# Patient Record
Sex: Female | Born: 1960 | Race: White | Hispanic: No | Smoking: Never smoker
Health system: Southern US, Community
[De-identification: ages and names within clinical notes are randomized; demographics above are authoritative.]

## PROBLEM LIST (undated history)

## (undated) DIAGNOSIS — G35 Multiple sclerosis: Secondary | ICD-10-CM

## (undated) HISTORY — PX: CARPAL TUNNEL RELEASE: SHX101

## (undated) HISTORY — PX: TONSILLECTOMY: SUR1361

## (undated) HISTORY — DX: Multiple sclerosis: G35

---

## 2003-04-22 ENCOUNTER — Other Ambulatory Visit: Admission: RE | Admit: 2003-04-22 | Discharge: 2003-04-22 | Payer: Self-pay | Admitting: *Deleted

## 2003-04-24 ENCOUNTER — Encounter: Admission: RE | Admit: 2003-04-24 | Discharge: 2003-04-24 | Payer: Self-pay | Admitting: *Deleted

## 2004-03-11 ENCOUNTER — Ambulatory Visit (HOSPITAL_BASED_OUTPATIENT_CLINIC_OR_DEPARTMENT_OTHER): Admission: RE | Admit: 2004-03-11 | Discharge: 2004-03-11 | Payer: Self-pay | Admitting: Orthopedic Surgery

## 2005-03-16 ENCOUNTER — Encounter: Admission: RE | Admit: 2005-03-16 | Discharge: 2005-03-16 | Payer: Self-pay | Admitting: Family Medicine

## 2005-05-10 ENCOUNTER — Ambulatory Visit (HOSPITAL_COMMUNITY): Admission: RE | Admit: 2005-05-10 | Discharge: 2005-05-10 | Payer: Self-pay | Admitting: Neurology

## 2008-09-03 ENCOUNTER — Emergency Department (HOSPITAL_COMMUNITY): Admission: EM | Admit: 2008-09-03 | Discharge: 2008-09-03 | Payer: Self-pay | Admitting: Emergency Medicine

## 2010-01-23 ENCOUNTER — Encounter: Payer: Self-pay | Admitting: Specialist

## 2010-05-20 NOTE — Op Note (Signed)
NAME:  Angela Monroe, Angela Monroe                   ACCOUNT NO.:  o   MEDICAL RECORD NO.:  [id number]          PATIENT TYPE:  AMB   LOCATION:  DSC                          FACILITY:  [facility]   PHYSICIAN:  Katy Fitch. Sypher Montez Hageman., M.D.DATE OF BIRTH:  1960-05-02   DATE OF PROCEDURE:  03/11/2004  DATE OF DISCHARGE:                                 OPERATIVE REPORT   PREOPERATIVE DIAGNOSIS:  Entrapment neuropathy median nerve, right carpal  tunnel.   POSTOPERATIVE DIAGNOSIS:  Entrapment neuropathy median nerve, right carpal  tunnel.   OPERATION:  Release of right transverse carpal ligament.   OPERATING SURGEON:  Katy Fitch. Sypher, M.D.   ASSISTANT:  Jonni Sanger, P.A.   ANESTHESIA:  Eneral by LMA.   SUPERVISING ANESTHESIOLOGIST:  Dr. Gelene Mink.   INDICATIONS:  [name] a 50 year old woman referred by Dr. Trey Sailors,  neurosurgeon for evaluation and management of hand numbness and discomfort.  Clinical examination suggested carpal tunnel syndrome. Electrodiagnostic  studies by Dr. Johna Roles confirmed moderately severe right carpal tunnel  syndrome.   Due to her failure to respond to nonoperative measures including splinting,  activity modification and injection, she is brought to the operating room at  this time for release of right transverse carpal ligament.   PROCEDURE:  Nichele Slawson was brought to operating room and placed in supine  position on the operating table.  Following induction of general anesthesia  by LMA, the right arm was prepped with Betadine soap and solution and  sterilely draped.  Following exsanguination of the limb with an Esmarch  bandage, an arterial tourniquet on the proximal brachium was inflated to 220  mmHg.   The procedure commenced with a short incision in the line of the ring finger  in the palm. The subcutaneous tissues were carefully divided revealing the  palmar fascia. This was split longitudinally to reveal the common sensory  branch of the median  nerve.  These were followed back to transverse carpal  ligament, which was carefully isolated from the median nerve.  The  transverse carpal ligament released along its ulnar border extending into  the distal forearm.. This widely opened carpal canal.  No mass or other  predicaments were noted.   Bleeding points along the margin of the released ligament were  electrocauterized with bipolar current.  The wound was then repaired with  intradermal 3-0 Prolene suture.  A compressive dressing was applied with a  volar plaster splint maintaining the wrist in 5 degrees of dorsiflexion.     There were no apparent complications.  Ms. Sofia was then awakened from  anesthesia and transferred to recovery room with stable signs.  She will be  discharged prescription for Percocet 5 milligrams one p.o. q. 4 to 6 hours  p.r.n. pain, 20 tablets without refill.      RVS/MEDQ  D:  03/11/2004  T:  03/12/2004  Job:  [id number]

## 2011-05-26 IMAGING — CR DG KNEE COMPLETE 4+V*L*
4 series · 4 of 4 positions shown · non-contrast
Comparison: None available.

CLINICAL DATA: Fall, pain.

LEFT KNEE - COMPLETE 4+ VIEW

[t knee ap left]
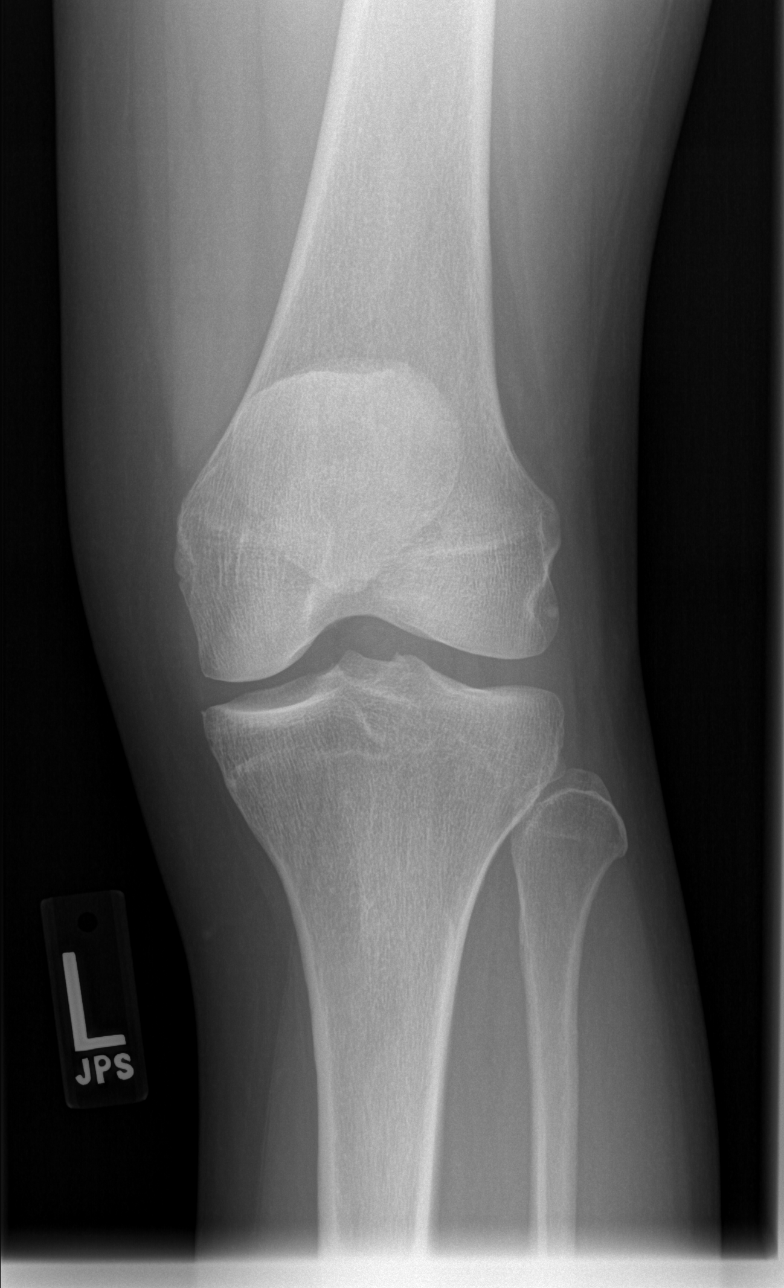

[t knee oblique left (1 of 2)]
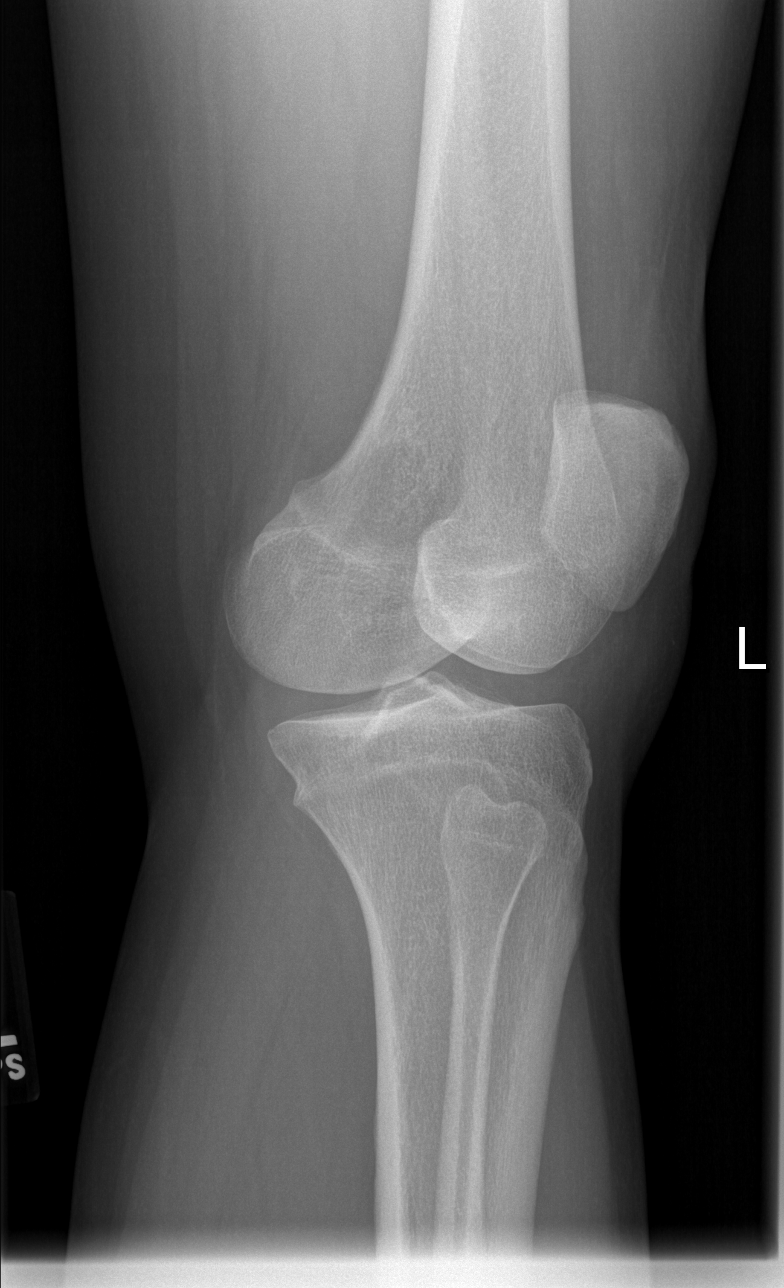

[t knee oblique left (2 of 2)]
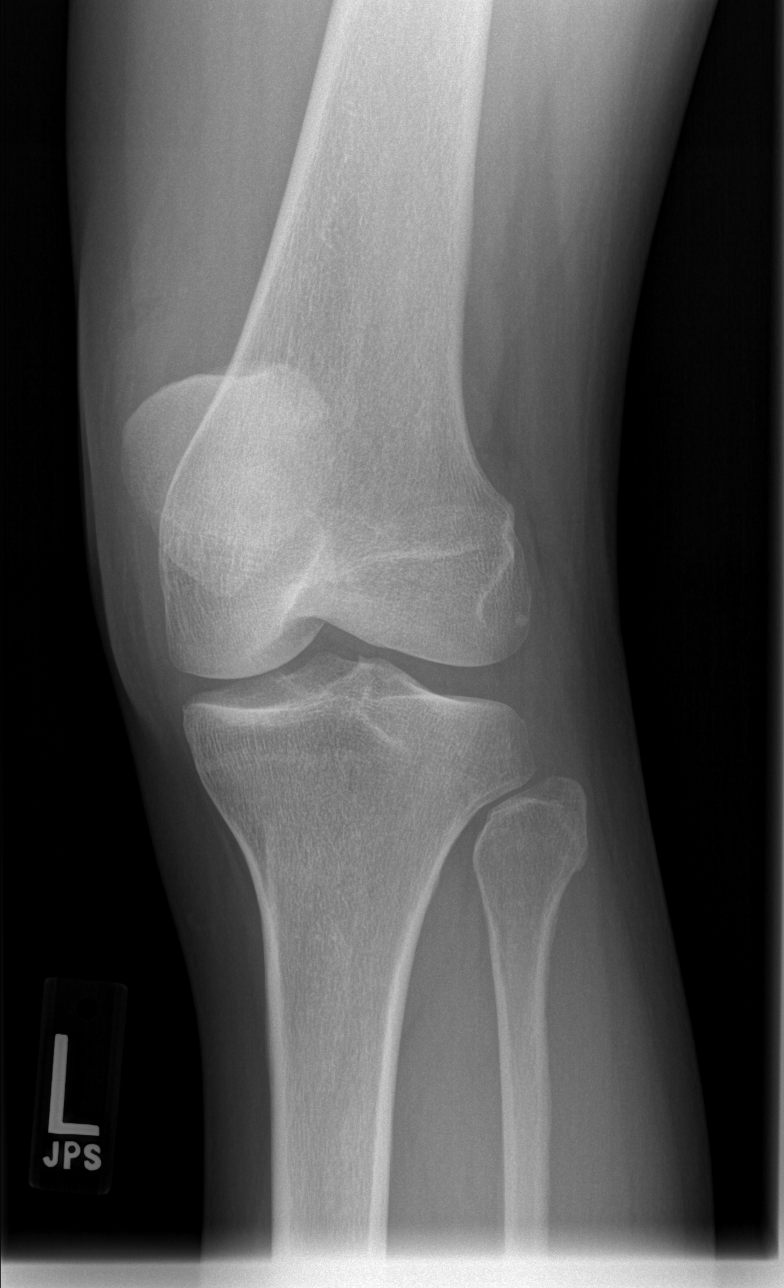

[t knee lat left]
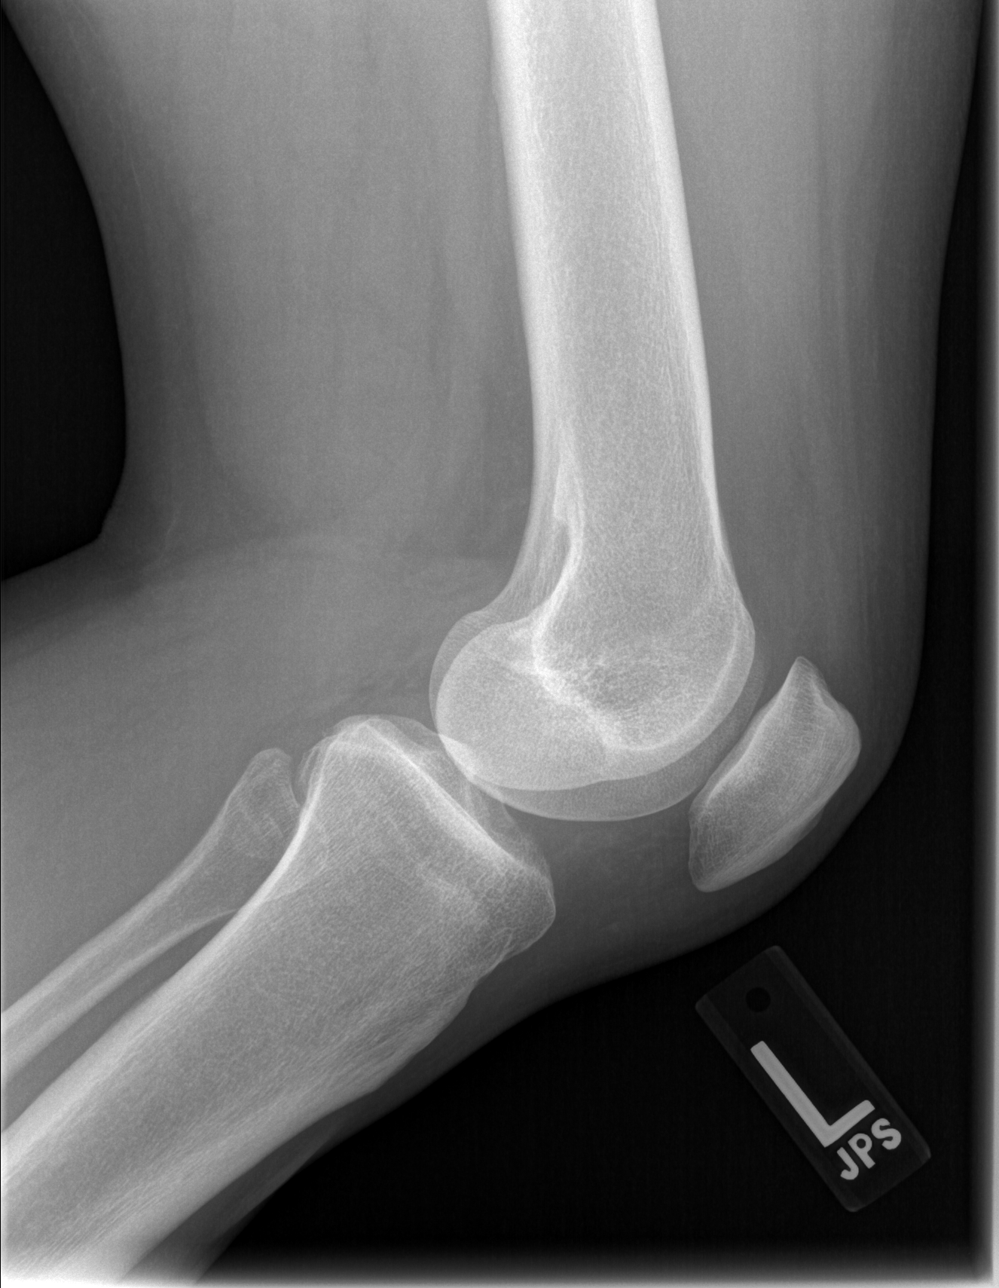

[4 of 4 positions shown; findings below may reference images not displayed]

FINDINGS: Imaged bones, joints and soft tissues appear normal.
IMPRESSION: Negative exam.

## 2011-06-07 ENCOUNTER — Ambulatory Visit (INDEPENDENT_AMBULATORY_CARE_PROVIDER_SITE_OTHER): Payer: Managed Care, Other (non HMO) | Admitting: Internal Medicine

## 2011-06-07 ENCOUNTER — Encounter: Payer: Self-pay | Admitting: Internal Medicine

## 2011-06-07 VITALS — BP 142/80 | HR 55 | Temp 97.9°F | Ht 66.0 in | Wt 168.0 lb

## 2011-06-07 DIAGNOSIS — M25559 Pain in unspecified hip: Secondary | ICD-10-CM

## 2011-06-07 DIAGNOSIS — G35 Multiple sclerosis: Secondary | ICD-10-CM | POA: Insufficient documentation

## 2011-06-07 MED ORDER — MELOXICAM 7.5 MG PO TABS: 7.5000 mg | ORAL_TABLET | Freq: Every day | ORAL | Status: AC | PRN

## 2011-06-07 NOTE — Patient Instructions (Addendum)
Take meloxicam as prescribed, always taken with food, watch for stomach side effects such as pain, nausea or change in the color of the stools. Call in 2 to 3 weeks if you're not improving, call anytime if you've worse ------- Please get your x-ray at the other [place]  office located at: [address] [place], across from [facility].  Please go to the basement, this is a walk-in facility, they are open from 8:30 to 5:30 PM. Phone number [phone].

## 2011-06-07 NOTE — Assessment & Plan Note (Addendum)
Few months history of right-sided pain, around the hip. Some of the pain involves the right buttock. Most likely pain is hip related, less likely is a radiculopathy. Patient wonder if it is related with "the  chair I'm using at work" but I can't tell if that is the case. Reports right hip injury many years ago. Plan: X-rays Meloxicam If not better consider also referral to ortho

## 2011-06-07 NOTE — Progress Notes (Signed)
  Subjective:    Patient ID: Angela Monroe, female    DOB: 1960/09/11, 51 y.o.   MRN: [id number]  HPI New patient, acute visit. 5-6 month history of "hip pain". Pain is located at the right buttock and anterior side of the right hip. It usually gets worse or better depending on the position. Aleve does help temporarily. Wonders if pain related to her new job where she has to sit almost all day.  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  Family history Diabetes--no CAD--no Stroke-- F MS-- sister Colon cancer--no Breast cancer--no   Review of Systems Denies any rash at the area of the pain. 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 Very seldom has mild bilateral low back pain.    Objective:   Physical Exam  General -- alert, well-developed. No apparent distress.  Lungs -- normal respiratory effort, no intercostal retractions, no accessory muscle use, and normal breath sounds.   Heart-- normal rate, regular rhythm, no murmur, and no gallop.   Musculoskeletal: Not tender to palpation in the lower back..   Extremities-- no pretibial edema bilaterally , hip rotation normal, no tender at the trochanteric bursa on either side. Neurologic-- alert & oriented X3 , gait and motor are normal, DTRs symmetric, slightly decreased at both ankles. Psych-- Cognition and judgment appear intact. Alert and cooperative with normal attention span and concentration.  not anxious appearing and not depressed appearing.         Assessment & Plan:

## 2011-10-13 ENCOUNTER — Ambulatory Visit (INDEPENDENT_AMBULATORY_CARE_PROVIDER_SITE_OTHER): Payer: Managed Care, Other (non HMO) | Admitting: Family Medicine

## 2011-10-13 VITALS — BP 143/84 | HR 59 | Temp 98.4°F | Resp 16 | Ht 67.0 in | Wt 170.0 lb

## 2011-10-13 DIAGNOSIS — Z13220 Encounter for screening for lipoid disorders: Secondary | ICD-10-CM

## 2011-10-13 LAB — LIPID PANEL
Cholesterol: 249 mg/dL — ABNORMAL HIGH (ref 0–200)
HDL: 41 mg/dL (ref >39)
LDL Cholesterol: 176 mg/dL — ABNORMAL HIGH (ref 0–99)
Total CHOL/HDL Ratio: 6.1 ratio
Triglycerides: 162 mg/dL — ABNORMAL HIGH (ref <150)
VLDL: 32 mg/dL (ref 0–40)

## 2011-10-13 NOTE — Progress Notes (Signed)
  [facility]:  Office Visit  Chief Complaint:  Chief Complaint  Patient presents with  . Annual Exam    for insurance, +lipid panel    HPI: Angela Monroe is a 51 y.o. female who complains of  Here for screening lipids for work. No complaints.   Past Medical History  Diagnosis Date  . MS (multiple sclerosis)    Past Surgical History  Procedure Date  . Tonsillectomy    History   Social History  . Marital Status: Single    Spouse Name: N/A    Number of Children: N/A  . Years of Education: N/A   Social History Main Topics  . Smoking status: Never Smoker   . Smokeless tobacco: Never Used  . Alcohol Use: No  . Drug Use: No  . Sexually Active:    Other Topics Concern  . None   Social History Narrative  . None   Family History  Problem Relation Age of Onset  . Hypertension Mother   . Stroke Father   . Hypertension Father    Allergies  Allergen Reactions  . Contrast Media (Iodinated Diagnostic Agents)     Per pt-- "dry mouth, affect and my salivary glands, I don't like it again"   Prior to Admission medications   Medication Sig Start Date End Date Taking? Authorizing Provider  Glatiramer Acetate (COPAXONE [place]) Inject into the skin daily.   Yes Historical Provider, MD  meloxicam (MOBIC) 7.5 MG tablet Take 1-2 tablets (7.5-15 mg total) by mouth daily as needed for pain. 06/07/11 06/06/12  [name], MD     ROS: The patient denies fevers, chills, night sweats, unintentional weight loss, chest pain, palpitations, wheezing, dyspnea on exertion, nausea, vomiting, abdominal pain, dysuria, hematuria, melena, numbness, weakness, or tingling.   All other systems have been reviewed and were otherwise negative with the exception of those mentioned in the HPI and as above.    PHYSICAL EXAM: Filed Vitals:   10/13/11 0815  BP: 143/84  Pulse: 59  Temp: 98.4 F (36.9 C)  Resp: 16   Filed Vitals:   10/13/11 0815  Height: 5\' 7"  (1.702 m)  Weight: 170  lb (77.111 kg)   Body mass index is 26.63 kg/(m^2).  General: Alert, no acute distress HEENT:  Normocephalic, atraumatic, oropharynx patent.  Cardiovascular:  Regular rate and rhythm, no rubs murmurs or gallops.  No Carotid bruits, radial pulse intact. No pedal edema.  Respiratory: Clear to auscultation bilaterally.  No wheezes, rales, or rhonchi.  No cyanosis, no use of accessory musculature GI: No organomegaly, abdomen is soft and non-tender, positive bowel sounds.  No masses. Skin: No rashes. Neurologic: Facial musculature symmetric. Psychiatric: Patient is appropriate throughout our interaction. Lymphatic: No cervical lymphadenopathy Musculoskeletal: Gait intact.   LABS: No results found for this or any previous visit.   EKG/XRAY:   Primary read interpreted by Dr. Conley Rolls at [facility].   ASSESSMENT/PLAN: Encounter Diagnosis  Name Primary?  . Screening for hyperlipidemia Yes   Lipid panel.  She was advised that it may be tainted since she had coffeee with cream and sugar but she just wants it done anyways.    ,  PHUONG, DO 10/13/2011 8:49 AM

## 2011-10-15 ENCOUNTER — Encounter: Payer: Self-pay | Admitting: Family Medicine

## 2012-02-17 ENCOUNTER — Other Ambulatory Visit: Payer: Self-pay

## 2012-09-23 ENCOUNTER — Encounter: Payer: Self-pay | Admitting: Gastroenterology

## 2012-10-18 ENCOUNTER — Ambulatory Visit: Payer: Managed Care, Other (non HMO) | Admitting: Gastroenterology

## 2012-11-07 ENCOUNTER — Other Ambulatory Visit: Payer: Self-pay

## 2013-04-13 ENCOUNTER — Ambulatory Visit (INDEPENDENT_AMBULATORY_CARE_PROVIDER_SITE_OTHER): Payer: Managed Care, Other (non HMO) | Admitting: Family Medicine

## 2013-04-13 VITALS — BP 146/92 | HR 71 | Temp 97.8°F | Resp 16 | Ht 65.5 in | Wt 175.0 lb

## 2013-04-13 DIAGNOSIS — R1031 Right lower quadrant pain: Secondary | ICD-10-CM

## 2013-04-13 MED ORDER — MELOXICAM 7.5 MG PO TABS: 7.5000 mg | ORAL_TABLET | Freq: Every day | ORAL | Status: DC

## 2013-04-13 NOTE — Progress Notes (Signed)
This chart was scribed for Elvina SidleKurt Lauenstein, MD, by Yevette EdwardsAngela Bracken, ED Scribe. This patient's care was started at 4:44 PM.  Patient ID: Angela Monroe, female   DOB: February 13, 1960, 53 y.o.   MRN: [id number]      Patient Name: Angela Monroe Date of Birth: February 13, 1960 Medical Record Number: [id number] Gender: female Date of Encounter: 04/13/2013  Chief Complaint: Possible Infection   History of Present Illness:  Angela Monroe is a 53 y.o. very pleasant female patient, with a h/o MS, who presents with the following:  Pain and associated swelling to her right medial thigh which radiates to her right buttocks. The pain began five days ago and is increased with pressure. The pain began the day after she had used the site as an injection site for MS medication. She has used IBU with moderate relief. She denies a rash.   She reports she has not had a flair-up of MS for five years.   Patient Active Problem List   Diagnosis Date Noted   Hip pain 06/07/2011   Multiple sclerosis 06/07/2011   Past Medical History  Diagnosis Date   MS (multiple sclerosis)    Past Surgical History  Procedure Laterality Date   Tonsillectomy     History  Substance Use Topics   Smoking status: Never Smoker    Smokeless tobacco: Never Used   Alcohol Use: No   Family History  Problem Relation Age of Onset   Hypertension Mother    Stroke Father    Hypertension Father    Allergies  Allergen Reactions   Contrast Media [Iodinated Diagnostic Agents]     Per pt-- "dry mouth, affect and my salivary glands, I don't like it again"    Medication list has been reviewed and updated.  Current Outpatient Prescriptions on File Prior to Visit  Medication Sig Dispense Refill   Glatiramer Acetate (COPAXONE [place]) Inject into the skin daily.       No current facility-administered medications on file prior to visit.    Review of Systems:  Positive: right thigh pain and associated swelling Negative:  rash  Physical Examination: Filed Vitals:   04/13/13 1630  BP: 146/92  Pulse: 71  Temp: 97.8 F (36.6 C)  Resp: 16   Body mass index is 28.67 kg/(m^2). Ideal Body Weight: @FLOWAMB ([id number])@[phone]@  Mild tenderness to medial right proximal thigh with good ROM and no skin discoloration or rash.   EKG / Labs / Xrays: None available at time of encounter  Assessment and Plan:  Will prescribe pt Meloxicam Informed pt to follow-up if a rash develops.   Yevette EdwardsAngela Bracken, ED Scribe Right groin pain - Plan: meloxicam (MOBIC) 7.5 MG tablet  Signed, Elvina SidleKurt Lauenstein, MD

## 2014-05-02 ENCOUNTER — Ambulatory Visit (INDEPENDENT_AMBULATORY_CARE_PROVIDER_SITE_OTHER): Payer: Managed Care, Other (non HMO) | Admitting: Emergency Medicine

## 2014-05-02 VITALS — BP 116/90 | HR 60 | Temp 97.9°F | Resp 12 | Ht 65.25 in | Wt 173.5 lb

## 2014-05-02 DIAGNOSIS — H8110 Benign paroxysmal vertigo, unspecified ear: Secondary | ICD-10-CM | POA: Diagnosis not present

## 2014-05-02 MED ORDER — MECLIZINE HCL 25 MG PO TABS: 25.0000 mg | ORAL_TABLET | Freq: Three times a day (TID) | ORAL | Status: DC | PRN

## 2014-05-02 NOTE — Patient Instructions (Signed)
Benign Positional Vertigo Vertigo means you feel like you or your surroundings are moving when they are not. Benign positional vertigo is the most common form of vertigo. Benign means that the cause of your condition is not serious. Benign positional vertigo is more common in older adults. CAUSES  Benign positional vertigo is the result of an upset in the labyrinth system. This is an area in the middle ear that helps control your balance. This may be caused by a viral infection, head injury, or repetitive motion. However, often no specific cause is found. SYMPTOMS  Symptoms of benign positional vertigo occur when you move your head or eyes in different directions. Some of the symptoms may include:  Loss of balance and falls.  Vomiting.  Blurred vision.  Dizziness.  Nausea.  Involuntary eye movements (nystagmus). DIAGNOSIS  Benign positional vertigo is usually diagnosed by physical exam. If the specific cause of your benign positional vertigo is unknown, your caregiver may perform imaging tests, such as magnetic resonance imaging (MRI) or computed tomography (CT). TREATMENT  Your caregiver may recommend movements or procedures to correct the benign positional vertigo. Medicines such as meclizine, benzodiazepines, and medicines for nausea may be used to treat your symptoms. In rare cases, if your symptoms are caused by certain conditions that affect the inner ear, you may need surgery. HOME CARE INSTRUCTIONS   Follow your caregiver's instructions.  Move slowly. Do not make sudden body or head movements.  Avoid driving.  Avoid operating heavy machinery.  Avoid performing any tasks that would be dangerous to you or others during a vertigo episode.  Drink enough fluids to keep your urine clear or pale yellow. SEEK IMMEDIATE MEDICAL CARE IF:   You develop problems with walking, weakness, numbness, or using your arms, hands, or legs.  You have difficulty speaking.  You develop  severe headaches.  Your nausea or vomiting continues or gets worse.  You develop visual changes.  Your family or friends notice any behavioral changes.  Your condition gets worse.  You have a fever.  You develop a stiff neck or sensitivity to light. MAKE SURE YOU:   Understand these instructions.  Will watch your condition.  Will get help right away if you are not doing well or get worse. Document Released: 09/26/2005 Document Revised: 03/13/2011 Document Reviewed: 09/08/2010 ExitCare Patient Information 2015 [employer]. This information is not intended to replace advice given to you by your health care provider. Make sure you discuss any questions you have with your health care provider.    

## 2014-05-02 NOTE — Progress Notes (Signed)
[facility] [address] [place] [address] [phone]  Date:  05/02/2014   Name:  Angela Monroe   DOB:  September 17, 1960   MRN:  [id number]  PCP:  Willow Ora, MD    Chief Complaint: Dizziness; Hypertension; and Stress   History of Present Illness:  Angela Monroe is a 54 y.o. very pleasant female patient who presents with the following:  Patient has a history of vertigo over the past 10 days.  Worse when gets up in the morning and turns her head or looks up Says has improved over the past week. No headache, neuro or visual symptoms. No chest pain or tightness in chest. Was concerned his blood pressure was elevated this morning. No antecedent illness or injury. No improvement with over the counter medications or other home remedies.  Denies other complaint or health concern today.   Patient Active Problem List   Diagnosis Date Noted  . Hip pain 06/07/2011  . Multiple sclerosis 06/07/2011    Past Medical History  Diagnosis Date  . MS (multiple sclerosis)     Past Surgical History  Procedure Laterality Date  . Tonsillectomy      History  Substance Use Topics  . Smoking status: Never Smoker   . Smokeless tobacco: Never Used  . Alcohol Use: No    Family History  Problem Relation Age of Onset  . Hypertension Mother   . Stroke Father   . Hypertension Father     Allergies  Allergen Reactions  . Contrast Media [Iodinated Diagnostic Agents]     Per pt-- "dry mouth, affect and my salivary glands, I don't like it again"    Medication list has been reviewed and updated.  Current Outpatient Prescriptions on File Prior to Visit  Medication Sig Dispense Refill  . Glatiramer Acetate (COPAXONE [place]) Inject into the skin daily.     No current facility-administered medications on file prior to visit.    Review of Systems:  Review of Systems  Constitutional: Negative for fever, chills and fatigue.  HENT: Negative for congestion, ear pain,  hearing loss, postnasal drip, rhinorrhea and sinus pressure.   Eyes: Negative for discharge and redness.  Respiratory: Negative for cough, shortness of breath and wheezing.   Cardiovascular: Negative for chest pain and leg swelling.  Gastrointestinal: Negative for nausea, vomiting, abdominal pain, constipation and blood in stool.  Genitourinary: Negative for dysuria, urgency and frequency.  Musculoskeletal: Negative for neck stiffness.  Skin: Negative for rash.  Neurological: Negative for seizures, weakness and headaches.     Physical Examination: Filed Vitals:   05/02/14 0959  BP: 116/90  Pulse: 60  Temp: 97.9 F (36.6 C)  Resp: 12   Filed Vitals:   05/02/14 0959  Height: 5' 5.25" (1.657 m)  Weight: 173 lb 8 oz (78.699 kg)   Body mass index is 28.66 kg/(m^2). Ideal Body Weight: Weight in (lb) to have BMI = 25: 151.1  GEN: WDWN, NAD, Non-toxic, A & O x 3 HEENT: Atraumatic, Normocephalic. Neck supple. No masses, No LAD. Ears and Nose: No external deformity. CV: RRR, No M/G/R. No JVD. No thrill. No extra heart sounds. PULM: CTA B, no wheezes, crackles, rhonchi. No retractions. No resp. distress. No accessory muscle use. ABD: S, NT, ND, +BS. No rebound. No HSM. EXTR: No c/c/e NEURO Normal gait. Romberg and tandem gait intact.  PRRERLA EOMI CN 2-12 intact PSYCH: Normally interactive. Conversant. Not depressed or anxious appearing.  Calm demeanor.  Assessment and Plan: BPV antivert   Signed Phillips Odor, MD

## 2015-01-08 ENCOUNTER — Telehealth: Payer: Self-pay | Admitting: Behavioral Health

## 2015-01-08 NOTE — Telephone Encounter (Signed)
The following care gaps were assessed: Mammogram Pap Colonoscopy  Attempted to reach patient to schedule a physical with PCP. Left a message for the patient to return call when available.

## 2015-11-04 ENCOUNTER — Ambulatory Visit: Payer: Managed Care, Other (non HMO) | Admitting: Allergy & Immunology

## 2015-11-08 ENCOUNTER — Ambulatory Visit (INDEPENDENT_AMBULATORY_CARE_PROVIDER_SITE_OTHER): Payer: BLUE CROSS/BLUE SHIELD | Admitting: Allergy & Immunology

## 2015-11-08 ENCOUNTER — Encounter: Payer: Self-pay | Admitting: Allergy & Immunology

## 2015-11-08 VITALS — BP 134/90 | HR 70 | Temp 97.7°F | Ht 65.0 in | Wt 163.0 lb

## 2015-11-08 DIAGNOSIS — L508 Other urticaria: Secondary | ICD-10-CM | POA: Diagnosis not present

## 2015-11-08 DIAGNOSIS — T781XXD Other adverse food reactions, not elsewhere classified, subsequent encounter: Secondary | ICD-10-CM | POA: Diagnosis not present

## 2015-11-08 MED ORDER — EPINEPHRINE 0.3 MG/0.3ML IJ SOAJ
0.3000 mg | Freq: Once | INTRAMUSCULAR | 2 refills | Status: AC
Start: 2015-11-08 — End: 2015-11-08

## 2015-11-08 NOTE — Patient Instructions (Addendum)
1. Chronic urticaria - Your history does not show any features of serious causes of hives (fevers, joint pain, permanent skin changes, etc). - However, we will get some lab work to rule out these serious causes: complete blood count, chronic urticaria panel, serum tryptase, alpha-gal panel - In the meantime, try using Allegra 180mg  (fexofenadine) as needed for breakthrough symptoms. - Allegra crosses the blood-brain barrier the least of all of the antihistamines and may not cause the sleepiness from other antihistamines. - If the hives are becoming a cumbersome issue, we could consider starting a monthly injectable medication called Xolair.   2. Adverse food reaction, subsequent encounter - Testing today was borderline positive for oysters, lobster, and salmon. - We will get blood work to confirm. - We will send in an EpiPen to use just in case symptoms become very serious. - Keep a food diary and we can do more targeted testing in the future.  3. Return in about 3 months (around 02/08/2016).  Please inform us of any Emergency Department visits, hospitalizations, or changes in symptoms. Call us before going to the ED for breathing or allergy symptoms since we might be able to fit you in for a sick visit. Feel free to contact us anytime with any questions, problems, or concerns.  It was a pleasure to meet you today!   Websites that have reliable patient information: 1. American Academy of Asthma, Allergy, and Immunology: www.aaaai.org 2. Food Allergy Research and Education (FARE): foodallergy.org 3. Mothers of Asthmatics: http://www.asthmacommunitynetwork.org 4. American College of Allergy, Asthma, and Immunology: www.acaai.org

## 2015-11-08 NOTE — Progress Notes (Signed)
NEW PATIENT  Date of Service/Encounter:  11/08/15   Assessment:   Adverse food reaction, subsequent encounter  Chronic urticaria   Plan/Recommendations:   1. Chronic urticaria - Ms. Colford's history does not show any features of serious causes of hives (fevers, joint pain, permanent skin changes, etc). - History is not entirely consistent with an IgE mediated food allergy, given a prolonged time course between ingestion of the food in the symptoms. - Typically food allergies present with than 30-60 minutes after eating the food. - However, we will get some lab work to rule out these serious causes: complete blood count, chronic urticaria panel, serum tryptase, alpha-gal panel - In the meantime, I recommended trying Allegra '180mg'$  (fexofenadine) as needed for breakthrough symptoms. - Allegra crosses the blood-brain barrier the least of all of the antihistamines and may not cause the sleepiness from other antihistamines. - If the hives are becoming a cumbersome issue, we could consider starting a monthly injectable medication called Xolair.   2. Adverse food reaction, subsequent encounter - Testing today was borderline positive for oysters, lobster, and salmon. - We will get blood work to confirm. - We will send in an EpiPen to use just in case symptoms become very serious. - Keep a food diary and we can do more targeted testing in the future.  3. Return in about 3 months (around 02/08/2016).   Subjective:   Angela Monroe is a 55 y.o. female presenting today for evaluation of  Chief Complaint  Patient presents with  . New Evaluation    Interested in allergy testing. Common food item is yogurt or any probiotic. Causes swelling and hives.   Angela Monroe has a history of the following: Patient Active Problem List   Diagnosis Date Noted  . Hip pain 06/07/2011  . Multiple sclerosis ([place]) 06/07/2011    History obtained from: chart review and patient.  Angela Monroe was referred by Kathlene November, MD.     Angela Monroe is a 55 y.o. female presenting for allergy testing. She reports that she is allergic to "probiotics". She is certain that her reactions are secondary to probiotics and yogurt. This has been ongoing since 07/06/2015. This is when she first developed the hives. She thought at first that it was the feather pillows, but at the same time she also started increasing her yogurt intake. She has been trying to improve her diet that time. She does endorse hives that occur on the arms as well as the inner thighs. These hives are pruritic raised. She has no fevers with these hives or joint pain. Once the hives resolve, they will leave her normal skin. She did have swelling of her throat at one point, but otherwise no systemic symptoms including abdominal pain, vomiting, wheezing, or passing out. She has never needed to go to the ER for her symptoms. She treats her symptoms with cetirizine 5 mg when it occurs. This resolves her symptoms for approximately 2-3 days.  Angela Monroe has been experimenting with various foods over the past few months to see if these are related to her symptoms. She has gotten rid of peanut, tree nuts, and other major food allergens without improvement in her symptoms. Prior to this, she was tolerating peanuts and tree nuts. She does continue to eat eggs. She does not eat much soy. Again, her symptoms occur 9 hours after ingestion yogurt. She is able to tolerate other forms of dairy including milk and cheese. She does not  eat beef, but does eat pork every week or so. She denies tick bites, but she does live [place] acres [place] property.  Angela Monroe does not have a history of allergic rhinitis symptoms such as nasal congestion or rhinorrhea or ocular symptoms. She has no asthma and has never needed an inhaler. Of note, she does use hyperbaric chamber for approximately 1.5 hours 3 times per week. She reports that this helps with her multiple sclerosis  and she feels that this has prevented her development of asthma and allergies. Angela Monroe has a history of multiple sclerosis and is on Copaxone.   Otherwise, there is no history of other atopic diseases, including asthma, drug allergies, environmental allergies, stinging insect allergies, or urticaria. There is no significant infectious history. Vaccinations are up to date.    Past Medical History: Patient Active Problem List   Diagnosis Date Noted  . Hip pain 06/07/2011  . Multiple sclerosis ([place]) 06/07/2011    Medication List:    Medication List       Accurate as of 11/08/15 11:33 AM. Always use your most recent med list.          cetirizine 10 MG tablet Commonly known as:  ZYRTEC TAKE 1 TABLET (10 MG) BY MOUTH DAILY   COPAXONE [place] Inject into the skin daily.   multivitamin tablet Take 1 tablet by mouth daily.   RED YEAST RICE PO Take by mouth.       Birth History: non-contributory. Born at term without complications.   Developmental History: Angela Monroe has met all milestones on time. She has required no speech therapy, occupational therapy, or physical therapy.   Past Surgical History: Past Surgical History:  Procedure Laterality Date  . CARPAL TUNNEL RELEASE Right   . TONSILLECTOMY       Family History: Family History  Problem Relation Age of Onset  . Hypertension Mother   . Stroke Father   . Hypertension Father   . Allergic rhinitis Sister   . Angioedema Neg Hx   . Asthma Neg Hx   . Atopy Neg Hx   . Eczema Neg Hx   . Immunodeficiency Neg Hx   . Urticaria Neg Hx      Social History: Angela Monroe lives at home with by herself. Angela Monroe lives in a 56 year old home which she is renting out on [address]. There is carpeting throughout the home. She has electric heating and central cooling. There are no animals and throat are outside the home. There are no roach is or mildew problems. She does have dust mite covers on her pillows. There is no tobacco smoke exposure. She works as  a Landscape architect for [employer]. Her husband passed away approximately one year ago from complications of metastatic lung cancer. She lives on 3.5 acres of property, which includes 3 different houses. She rents her houses on [address].   Review of Systems: a 14-point review of systems is pertinent for what is mentioned in HPI.  Otherwise, all other systems were negative. Constitutional: negative other than that listed in the HPI Eyes: negative other than that listed in the HPI Ears, nose, mouth, throat, and face: negative other than that listed in the HPI Respiratory: negative other than that listed in the HPI Cardiovascular: negative other than that listed in the HPI Gastrointestinal: negative other than that listed in the HPI Genitourinary: negative other than that listed in the HPI Integument: negative other than that listed in the HPI Hematologic: negative other than that listed in  the HPI Musculoskeletal: negative other than that listed in the HPI Neurological: negative other than that listed in the HPI Allergy/Immunologic: negative other than that listed in the HPI    Objective:   Blood pressure 134/90, pulse 70, temperature 97.7 F (36.5 C), temperature source Oral, height '5\' 5"'$  (1.651 m), weight 163 lb (73.9 kg), last menstrual period 10/13/2011, SpO2 94 %. Body mass index is 27.12 kg/m.   Physical Exam:  General: Alert, interactive, in no acute distress. Cooperative with the exam. Interactive. HEENT: TMs pearly gray, turbinates non-edematous with clear discharge, post-pharynx erythematous. Neck: Supple without thyromegaly. Adenopathy: no enlarged lymph nodes appreciated in the anterior cervical, occipital, axillary, epitrochlear, inguinal, or popliteal regions Lungs: Clear to auscultation without wheezing, rhonchi or rales. No increased work of breathing. CV: Physiologic splitting of S1/S2, no murmurs. Capillary refill <2 seconds.  Abdomen: Nondistended, nontender. No guarding or  rebound tenderness. Bowel sounds faint and present in all fields  Skin: Warm and dry, isolated urticarial lesions noted on her left flank. Extremities:  No clubbing, cyanosis or edema. Neuro:   Grossly intact.  Diagnostic studies:   Allergy Studies:   Selected Foods Panel: Equivocal for oyster, lobster, and salmon. Negative to peanut, soy,, milk, egg, casein, shellfish mix, fish mix, cashew, pork, beef, lamb, flounder, round, shrimp, crab, tuna, pecan, walnut, scallops, almond, hazelnut, and [place] nut.    Salvatore Marvel, MD [place]

## 2015-11-11 LAB — CBC WITH DIFFERENTIAL/PLATELET
BASOS ABS: 0 {cells}/uL (ref 0–200)
Basophils Relative: 0 %
EOS PCT: 3 %
Eosinophils Absolute: 189 cells/uL (ref 15–500)
HEMATOCRIT: 46.3 % — AB (ref 35.0–45.0)
HEMOGLOBIN: 15.5 g/dL (ref 11.7–15.5)
LYMPHS ABS: 2142 {cells}/uL (ref 850–3900)
Lymphocytes Relative: 34 %
MCH: 29.6 pg (ref 27.0–33.0)
MCHC: 33.5 g/dL (ref 32.0–36.0)
MCV: 88.4 fL (ref 80.0–100.0)
MONO ABS: 693 {cells}/uL (ref 200–950)
MPV: 9.1 fL (ref 7.5–12.5)
Monocytes Relative: 11 %
NEUTROS ABS: 3276 {cells}/uL (ref 1500–7800)
Neutrophils Relative %: 52 %
Platelets: 245 10*3/uL (ref 140–400)
RBC: 5.24 MIL/uL — AB (ref 3.80–5.10)
RDW: 13.3 % (ref 11.0–15.0)
WBC: 6.3 10*3/uL (ref 3.8–10.8)

## 2015-11-12 LAB — ALLERGEN, SALMON, F41: Allergen, Salmon, f41: <0.1 kU/L

## 2015-11-12 LAB — ALLERGEN, OYSTER, F290

## 2015-11-12 LAB — TRYPTASE: Tryptase: 4.7 ug/L (ref <11)

## 2015-11-12 LAB — ALLERGEN LOBSTER: Lobster: <0.1 kU/L

## 2015-11-16 LAB — ALPHA-GAL PANEL
Beef IgE: 0.15 kU/L (ref <0.35)
CLASS: 0
CLASS: 0
Galactose-alpha-1,3-galactose IgE*: <0.1 kU/L (ref <0.35)

## 2015-11-18 LAB — CP CHRONIC URTICARIA INDEX PANEL
THYROID PEROXIDASE ANTIBODY: 807 [IU]/mL — AB (ref <9)
TSH: 3.37 m[IU]/L
Thyroglobulin Ab: 2 IU/mL — ABNORMAL HIGH (ref <2)

## 2015-11-22 ENCOUNTER — Telehealth: Payer: Self-pay | Admitting: *Deleted

## 2015-11-22 NOTE — Telephone Encounter (Signed)
Patient called requesting lab results please advise 

## 2015-11-24 ENCOUNTER — Encounter (HOSPITAL_COMMUNITY): Payer: Self-pay | Admitting: Emergency Medicine

## 2015-11-24 ENCOUNTER — Emergency Department (HOSPITAL_COMMUNITY)
Admission: EM | Admit: 2015-11-24 | Discharge: 2015-11-24 | Disposition: A | Discharge status: Home / Self Care | Payer: BLUE CROSS/BLUE SHIELD | Attending: Emergency Medicine | Admitting: Emergency Medicine

## 2015-11-24 DIAGNOSIS — R1011 Right upper quadrant pain: Secondary | ICD-10-CM | POA: Diagnosis not present

## 2015-11-24 LAB — COMPREHENSIVE METABOLIC PANEL
ALT: 15 U/L (ref 14–54)
ANION GAP: 11 (ref 5–15)
AST: 19 U/L (ref 15–41)
Albumin: 4.5 g/dL (ref 3.5–5.0)
Alkaline Phosphatase: 65 U/L (ref 38–126)
BUN: 16 mg/dL (ref 6–20)
CHLORIDE: 105 mmol/L (ref 101–111)
CO2: 24 mmol/L (ref 22–32)
CREATININE: 0.8 mg/dL (ref 0.44–1.00)
Calcium: 10.3 mg/dL (ref 8.9–10.3)
Glucose, Bld: 124 mg/dL — ABNORMAL HIGH (ref 65–99)
POTASSIUM: 4.2 mmol/L (ref 3.5–5.1)
SODIUM: 140 mmol/L (ref 135–145)
Total Bilirubin: 0.4 mg/dL (ref 0.3–1.2)
Total Protein: 7.6 g/dL (ref 6.5–8.1)

## 2015-11-24 LAB — CBC
HEMATOCRIT: 47 % — AB (ref 36.0–46.0)
HEMOGLOBIN: 16.1 g/dL — AB (ref 12.0–15.0)
MCH: 29.6 pg (ref 26.0–34.0)
MCHC: 34.3 g/dL (ref 30.0–36.0)
MCV: 86.4 fL (ref 78.0–100.0)
PLATELETS: 240 10*3/uL (ref 150–400)
RBC: 5.44 MIL/uL — AB (ref 3.87–5.11)
RDW: 13.3 % (ref 11.5–15.5)
WBC: 11.7 10*3/uL — AB (ref 4.0–10.5)

## 2015-11-24 LAB — LIPASE, BLOOD: LIPASE: 38 U/L (ref 11–51)

## 2015-11-24 NOTE — ED Provider Notes (Signed)
[facility]-EMERGENCY DEPT Provider Note   CSN: [id number] Arrival date & time: 11/24/15  1633     History   Chief Complaint Chief Complaint  Patient presents with  . Abdominal Pain    HPI Angela Monroe is a 55 y.o. female.  55 yo F with a chief complaints of right upper quadrant abdominal pain. This been going on for the past 4 years. Is slowly worsened. Has been worsening over the past week or so. Every time she seems to eat or drink starts about an hour afterwards. Lasts for about 2 hours at a time. Usually associated with vomiting. She denies any fevers or chills. Her pain is resolved by my history.   The history is provided by the patient.  Abdominal Pain   This is a new problem. The current episode started less than 1 hour ago. The problem occurs constantly. The problem has not changed since onset.The pain is located in the RUQ. The quality of the pain is sharp and shooting. The pain is at a severity of 10/10. The pain is severe. Associated symptoms include nausea and vomiting. Pertinent negatives include fever, dysuria, headaches, arthralgias and myalgias. The symptoms are aggravated by eating. Relieved by: drinking water.    Past Medical History:  Diagnosis Date  . MS (multiple sclerosis) [facility])     Patient Active Problem List   Diagnosis Date Noted  . Chronic urticaria 11/08/2015  . Hip pain 06/07/2011  . Multiple sclerosis ([facility]) 06/07/2011    Past Surgical History:  Procedure Laterality Date  . CARPAL TUNNEL RELEASE Right   . TONSILLECTOMY      OB History    No data available       Home Medications    Prior to Admission medications   Medication Sig Start Date End Date Taking? Authorizing Provider  cetirizine (ZYRTEC) 10 MG tablet TAKE 1 TABLET (10 MG) BY MOUTH DAILY 10/06/15   Historical Provider, MD  Glatiramer Acetate (COPAXONE [place]) Inject into the skin daily.    Historical Provider, MD  Multiple Vitamin (MULTIVITAMIN) tablet Take 1 tablet by mouth daily.     Historical Provider, MD  Red Yeast Rice Extract (RED YEAST RICE PO) Take by mouth.    Historical Provider, MD    Family History Family History  Problem Relation Age of Onset  . Hypertension Mother   . Stroke Father   . Hypertension Father   . Allergic rhinitis Sister   . Angioedema Neg Hx   . Asthma Neg Hx   . Atopy Neg Hx   . Eczema Neg Hx   . Immunodeficiency Neg Hx   . Urticaria Neg Hx     Social History Social History  Substance Use Topics  . Smoking status: Never Smoker  . Smokeless tobacco: Never Used  . Alcohol use No     Allergies   Contrast media [iodinated diagnostic agents]   Review of Systems Review of Systems  Constitutional: Negative for chills and fever.  HENT: Negative for congestion and rhinorrhea.   Eyes: Negative for redness and visual disturbance.  Respiratory: Negative for shortness of breath and wheezing.   Cardiovascular: Negative for chest pain and palpitations.  Gastrointestinal: Positive for abdominal pain, nausea and vomiting.  Genitourinary: Negative for dysuria and urgency.  Musculoskeletal: Negative for arthralgias and myalgias.  Skin: Negative for pallor and wound.  Neurological: Negative for dizziness and headaches.     Physical Exam Updated Vital Signs BP 170/93 (BP Location: Right Arm)   Pulse 64  Temp 97.8 F (36.6 C) (Oral)   Resp 18   LMP 10/13/2011   SpO2 100%   Physical Exam  Constitutional: She is oriented to person, place, and time. She appears well-developed and well-nourished. No distress.  HENT:  Head: Normocephalic and atraumatic.  Eyes: EOM are normal. Pupils are equal, round, and reactive to light.  Neck: Normal range of motion. Neck supple.  Cardiovascular: Normal rate and regular rhythm.  Exam reveals no gallop and no friction rub.   No murmur heard. Pulmonary/Chest: Effort normal. She has no wheezes. She has no rales.  Abdominal: Soft. She exhibits no distension and no mass. There is no tenderness.  There is no guarding.  Musculoskeletal: She exhibits no edema or tenderness.  Neurological: She is alert and oriented to person, place, and time.  Skin: Skin is warm and dry. She is not diaphoretic.  Psychiatric: She has a normal mood and affect. Her behavior is normal.  Nursing note and vitals reviewed.    ED Treatments / Results  Labs (all labs ordered are listed, but only abnormal results are displayed) Labs Reviewed  COMPREHENSIVE METABOLIC PANEL - Abnormal; Notable for the following:       Result Value   Glucose, Bld 124 (*)    All other components within normal limits  CBC - Abnormal; Notable for the following:    WBC 11.7 (*)    RBC 5.44 (*)    Hemoglobin 16.1 (*)    HCT 47.0 (*)    All other components within normal limits  LIPASE, BLOOD    EKG  EKG Interpretation None       Radiology No results found.  Procedures Procedures (including critical care time)  Medications Ordered in ED Medications - No data to display   Initial Impression / Assessment and Plan / ED Course  I have reviewed the triage vital signs and the nursing notes.  Pertinent labs & imaging results that were available during my care of the patient were reviewed by me and considered in my medical decision making (see chart for details).  Clinical Course     55 yo F With a chief complaint of recurrent right upper quadrant abdominal pain. Usually worse after eating. I suspect this is the patient's gallbladder. She is not febrile she is not currently having pain. I do not see the utility of an emergent ultrasound at this time. We'll have her follow-up with general surgery for ultrasound and possible elective removal. Also in the differential would be a duodenal or stomach ulcer. Will start the patient on Zantac.  5:54 PM:  I have discussed the diagnosis/risks/treatment options with the patient and family and believe the pt to be eligible for discharge home to follow-up with PCP. We also  discussed returning to the ED immediately if new or worsening sx occur. We discussed the sx which are most concerning (e.g., sudden worsening pain, fever, inability to tolerate by mouth) that necessitate immediate return. Medications administered to the patient during their visit and any new prescriptions provided to the patient are listed below.  Medications given during this visit Medications - No data to display   The patient appears reasonably screen and/or stabilized for discharge and I doubt any other medical condition or other [facility] requiring further screening, evaluation, or treatment in the ED at this time prior to discharge.      Final Clinical Impressions(s) / ED Diagnoses   Final diagnoses:  RUQ abdominal pain    New Prescriptions New Prescriptions  No medications on file     Melene Plan, DO 11/24/15 1754

## 2015-11-24 NOTE — ED Triage Notes (Signed)
Pt reports having intermittent episodes of abd pain after eating. Reports this started approx 2 years ago and then had episode again today. Pt has severe pain and then n/v. No acute distress noted at triage.

## 2015-11-24 NOTE — Discharge Instructions (Signed)
Try zantac 150mg twice a day.  ° ° °

## 2018-12-06 ENCOUNTER — Other Ambulatory Visit: Payer: Self-pay

## 2018-12-06 DIAGNOSIS — Z20822 Contact with and (suspected) exposure to covid-19: Secondary | ICD-10-CM

## 2018-12-08 LAB — NOVEL CORONAVIRUS, NAA: SARS-CoV-2, NAA: NOT DETECTED

## 2019-01-16 DIAGNOSIS — N3000 Acute cystitis without hematuria: Secondary | ICD-10-CM | POA: Diagnosis not present

## 2019-01-28 DIAGNOSIS — G35 Multiple sclerosis: Secondary | ICD-10-CM | POA: Diagnosis not present

## 2019-07-08 DIAGNOSIS — G35 Multiple sclerosis: Secondary | ICD-10-CM | POA: Diagnosis not present

## 2019-08-04 DIAGNOSIS — H04123 Dry eye syndrome of bilateral lacrimal glands: Secondary | ICD-10-CM | POA: Diagnosis not present

## 2019-08-04 DIAGNOSIS — H2513 Age-related nuclear cataract, bilateral: Secondary | ICD-10-CM | POA: Diagnosis not present

## 2019-08-04 DIAGNOSIS — H40033 Anatomical narrow angle, bilateral: Secondary | ICD-10-CM | POA: Diagnosis not present

## 2019-08-04 DIAGNOSIS — H40013 Open angle with borderline findings, low risk, bilateral: Secondary | ICD-10-CM | POA: Diagnosis not present

## 2020-01-06 DIAGNOSIS — G35 Multiple sclerosis: Secondary | ICD-10-CM | POA: Diagnosis not present

## 2020-01-06 DIAGNOSIS — Z5181 Encounter for therapeutic drug level monitoring: Secondary | ICD-10-CM | POA: Diagnosis not present

## 2020-02-02 ENCOUNTER — Encounter: Payer: Self-pay | Admitting: Family Medicine

## 2020-02-02 ENCOUNTER — Other Ambulatory Visit: Payer: Self-pay

## 2020-02-02 ENCOUNTER — Ambulatory Visit (INDEPENDENT_AMBULATORY_CARE_PROVIDER_SITE_OTHER): Payer: Managed Care, Other (non HMO) | Admitting: Family Medicine

## 2020-02-02 VITALS — BP 142/94 | HR 63 | Temp 96.9°F | Ht 65.0 in | Wt 170.8 lb

## 2020-02-02 DIAGNOSIS — G9331 Postviral fatigue syndrome: Secondary | ICD-10-CM

## 2020-02-02 DIAGNOSIS — G933 Postviral fatigue syndrome: Secondary | ICD-10-CM | POA: Diagnosis not present

## 2020-02-02 DIAGNOSIS — R030 Elevated blood-pressure reading, without diagnosis of hypertension: Secondary | ICD-10-CM

## 2020-02-02 DIAGNOSIS — D751 Secondary polycythemia: Secondary | ICD-10-CM | POA: Insufficient documentation

## 2020-02-02 DIAGNOSIS — G35 Multiple sclerosis: Secondary | ICD-10-CM

## 2020-02-02 DIAGNOSIS — E785 Hyperlipidemia, unspecified: Secondary | ICD-10-CM | POA: Insufficient documentation

## 2020-02-02 DIAGNOSIS — Z862 Personal history of diseases of the blood and blood-forming organs and certain disorders involving the immune mechanism: Secondary | ICD-10-CM | POA: Diagnosis not present

## 2020-02-02 DIAGNOSIS — Z13220 Encounter for screening for lipoid disorders: Secondary | ICD-10-CM

## 2020-02-02 LAB — COMPREHENSIVE METABOLIC PANEL
ALT: 15 U/L (ref 0–35)
AST: 16 U/L (ref 0–37)
Albumin: 4.8 g/dL (ref 3.5–5.2)
Alkaline Phosphatase: 61 U/L (ref 39–117)
BUN: 14 mg/dL (ref 6–23)
CO2: 31 mEq/L (ref 19–32)
Calcium: 10.2 mg/dL (ref 8.4–10.5)
Chloride: 103 mEq/L (ref 96–112)
Creatinine, Ser: 0.79 mg/dL (ref 0.40–1.20)
GFR: 81.38 mL/min (ref >60.00)
Glucose, Bld: 101 mg/dL — ABNORMAL HIGH (ref 70–99)
Potassium: 4 mEq/L (ref 3.5–5.1)
Sodium: 139 mEq/L (ref 135–145)
Total Bilirubin: 0.6 mg/dL (ref 0.2–1.2)
Total Protein: 7.7 g/dL (ref 6.0–8.3)

## 2020-02-02 LAB — LIPID PANEL
Cholesterol: 265 mg/dL — ABNORMAL HIGH (ref 0–200)
HDL: 53.4 mg/dL (ref >39.00)
LDL Cholesterol: 185 mg/dL — ABNORMAL HIGH (ref 0–99)
NonHDL: 212.02
Total CHOL/HDL Ratio: 5
Triglycerides: 133 mg/dL (ref 0.0–149.0)
VLDL: 26.6 mg/dL (ref 0.0–40.0)

## 2020-02-02 LAB — CBC
HCT: 47.3 % — ABNORMAL HIGH (ref 36.0–46.0)
Hemoglobin: 16 g/dL — ABNORMAL HIGH (ref 12.0–15.0)
MCHC: 33.8 g/dL (ref 30.0–36.0)
MCV: 85.5 fl (ref 78.0–100.0)
Platelets: 261 10*3/uL (ref 150.0–400.0)
RBC: 5.53 Mil/uL — ABNORMAL HIGH (ref 3.87–5.11)
RDW: 14 % (ref 11.5–15.5)
WBC: 5.8 10*3/uL (ref 4.0–10.5)

## 2020-02-02 NOTE — Progress Notes (Signed)
[facility] [address] [place] [address] Dept: [phone] Dept Fax: [phone]  New Patient Office Visit  Subjective:    Patient ID: Angela Monroe, female    DOB: [birth year], 60 y.o..   MRN: [id number]   Chief Complaint  Patient presents with  . Establish Care    New patient, concerns about continued fatigue. WBC's elevated.    History of Present Illness:  Patient presents to establish care.  She notes she believes she may have had COVID-19 in mid-December. She had an acute episode with typical viral symptoms. She took several home COVID tests, all with negative results. Since that time, she had lingering headache, myalgias, and fatigue. She began taking a baby aspirin each day and noted resolution of all the symptoms except the fatigue.  Ms. Fitzhenry has a history of multiple sclerosis, diagnosed by MRI in 2007. At that time, she was experiencing numbness in fingers on both hands. She has a sister that has MS as well. In 2010, she developed diplopia that resolved after about 3 months. She notes that she has loss about 20% of her strength in her right calf. 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 She also notes intermittent issues with bladder contractility. Ms. Thielman is followed by a neurologist at [facility]. She is currently treated with Glatopa (glatiramer acetate). She was previously treated with Copaxone (glatiramer acetate), but developed recurrent urticaria with this formulation. Her neurologist is currently considering a change in medication to Avonex (interferon beta-1a). She does note her neurologist had discovered an elevated RBC on a recent blood test. Ms. Fobes states she reviewed her old records and says the RBC has been mildly high for at least 10 years.  Past Medical History: Patient Active Problem List   Diagnosis Date Noted  . History of polycythemia  02/02/2020  . Multiple sclerosis ([facility]) 06/07/2011    Past Surgical History:  Procedure Laterality Date  . CARPAL TUNNEL RELEASE Right   . TONSILLECTOMY     Family History  Problem Relation Age of Onset  . Hypertension Mother   . Stroke Father   . Hypertension Father   . Allergic rhinitis Sister   . Multiple sclerosis Sister   . Angioedema Neg Hx   . Asthma Neg Hx   . Atopy Neg Hx   . Eczema Neg Hx   . Immunodeficiency Neg Hx   . Urticaria Neg Hx     Outpatient Medications Prior to Visit  Medication Sig Dispense Refill  . aspirin 81 MG chewable tablet Chew 81 mg by mouth daily.    Marland Kitchen Glatiramer Acetate (COPAXONE [place]) Inject into the skin daily.    . Multiple Vitamin (MULTIVITAMIN) tablet Take 1 tablet by mouth daily.    . Cholecalciferol 50 MCG (2000 UT) CAPS Take by mouth. (Patient not taking: Reported on 02/02/2020)    . cetirizine (ZYRTEC) 10 MG tablet TAKE 1 TABLET (10 MG) BY MOUTH DAILY  2  . Red Yeast Rice Extract (RED YEAST RICE PO) Take by mouth.     No facility-administered medications prior to visit.    Allergies  Allergen Reactions  . Contrast Media [Iodinated Diagnostic Agents]     Per pt-- "dry mouth, affect and my salivary glands, I don't like it again"  . Copaxone [Glatiramer Acetate] Hives    Specific to this formulation. Now on Glatopa without hives.     Objective:   Today's Vitals  02/02/20 0825  BP: (!) 142/94  Pulse: 63  Temp: (!) 96.9 F (36.1 C)  TempSrc: Temporal  SpO2: 97%  Weight: 170 lb 12.8 oz (77.5 kg)  Height: 5\' 5"  (1.651 m)   Body mass index is 28.42 kg/m.     Assessment & Plan:   1. Multiple sclerosis (HCC) Currently stable on Glatopa daily injections. Continue to follow with neurology.  - Comprehensive metabolic panel  2. History of polycythemia This may be familial. Will check CBC to assess indices.  - CBC  3. Screening for cholesterol level Ms. Matthes notes this was elevated int he past. She was palced on a  cholesterol-lowering drug, but had a reaction (she is uncertain of the drug. She feels this contributed to a gallbladder attack at that time. She notes she stopped the medication and avoids well water now. She states she has had no further gallbladder attacks.  - Lipid panel  4. Postviral fatigue syndrome Ms. Ormond may very well have had a COVID-19 infection. We did discuss long COVID with the most common symptom being fatigue. We discussed what to expect related to this. There certainly could be some overlap with her MS as well. We will monitor this for now.  5. Elevated blood-pressure reading without diagnosis of hypertension BP was mildly;y high today, which she notes is not her usual. I recommend we reassess this in 3 months.   , MD

## 2020-02-05 ENCOUNTER — Encounter: Payer: Self-pay | Admitting: Family Medicine

## 2020-02-18 ENCOUNTER — Encounter: Payer: Self-pay | Admitting: Family Medicine

## 2020-03-09 ENCOUNTER — Encounter: Payer: Self-pay | Admitting: Family Medicine

## 2020-04-05 ENCOUNTER — Ambulatory Visit: Payer: Managed Care, Other (non HMO) | Admitting: Family Medicine

## 2020-04-29 ENCOUNTER — Other Ambulatory Visit: Payer: Self-pay

## 2020-04-30 ENCOUNTER — Encounter: Payer: Self-pay | Admitting: Family Medicine

## 2020-04-30 ENCOUNTER — Ambulatory Visit (INDEPENDENT_AMBULATORY_CARE_PROVIDER_SITE_OTHER): Payer: BC Managed Care – PPO | Admitting: Family Medicine

## 2020-04-30 VITALS — BP 148/100 | HR 60 | Temp 97.8°F | Ht 65.0 in | Wt 167.8 lb

## 2020-04-30 DIAGNOSIS — Z862 Personal history of diseases of the blood and blood-forming organs and certain disorders involving the immune mechanism: Secondary | ICD-10-CM

## 2020-04-30 DIAGNOSIS — G35 Multiple sclerosis: Secondary | ICD-10-CM

## 2020-04-30 DIAGNOSIS — E782 Mixed hyperlipidemia: Secondary | ICD-10-CM

## 2020-04-30 DIAGNOSIS — I10 Essential (primary) hypertension: Secondary | ICD-10-CM | POA: Diagnosis not present

## 2020-04-30 LAB — CBC
HCT: 47.4 % — ABNORMAL HIGH (ref 36.0–46.0)
Hemoglobin: 16 g/dL — ABNORMAL HIGH (ref 12.0–15.0)
MCHC: 33.8 g/dL (ref 30.0–36.0)
MCV: 86.2 fl (ref 78.0–100.0)
Platelets: 219 10*3/uL (ref 150.0–400.0)
RBC: 5.49 Mil/uL — ABNORMAL HIGH (ref 3.87–5.11)
RDW: 13.5 % (ref 11.5–15.5)
WBC: 5 10*3/uL (ref 4.0–10.5)

## 2020-04-30 LAB — COMPREHENSIVE METABOLIC PANEL
ALT: 22 U/L (ref 0–35)
AST: 20 U/L (ref 0–37)
Albumin: 4.9 g/dL (ref 3.5–5.2)
Alkaline Phosphatase: 51 U/L (ref 39–117)
BUN: 11 mg/dL (ref 6–23)
CO2: 30 mEq/L (ref 19–32)
Calcium: 10.2 mg/dL (ref 8.4–10.5)
Chloride: 103 mEq/L (ref 96–112)
Creatinine, Ser: 0.8 mg/dL (ref 0.40–1.20)
GFR: 80.02 mL/min (ref >60.00)
Glucose, Bld: 100 mg/dL — ABNORMAL HIGH (ref 70–99)
Potassium: 4.5 mEq/L (ref 3.5–5.1)
Sodium: 140 mEq/L (ref 135–145)
Total Bilirubin: 0.9 mg/dL (ref 0.2–1.2)
Total Protein: 8 g/dL (ref 6.0–8.3)

## 2020-04-30 MED ORDER — LISINOPRIL-HYDROCHLOROTHIAZIDE 20-12.5 MG PO TABS: 1.0000 | ORAL_TABLET | Freq: Every day | ORAL | 3 refills | Status: DC

## 2020-04-30 NOTE — Progress Notes (Signed)
[facility] [address] [place] [address] Dept: [phone] Dept Fax: [phone]  Chronic Care Office Visit  Subjective:    Patient ID: Angela Monroe, female    DOB: 01-Jul-1960, 60 y.o..   MRN: [id number]  Chief Complaint  Patient presents with  . Follow-up    3 month f/u HTN.  Wants to labs to check her kidney/liver functions due starting a new medication that can affect them.     History of Present Illness:  Patient is in today for reassessment of chronic medical issues. Ms. Markwell made the transition off Glatopa (glatiramer acetate) to taking Avonex (interferon beta1a) since her last visit. She notes she self-administers her injections weekly. She experiences flu-like symptoms for 1-2 days afterwards. She does feel she has had some progressive weakness in her right lower leg.   Ms. Fasig's blood pressure is elevated again today. She notes that she is ready to take action to address this.  We discussed her elevated lipid values from her last visit. Ms. Monger notes that she is not convinced that elevated cholesterol causes significant health effects or needs to be treated.  Ms. Osias had messaged me in early March, after starting Avonex, that she was noting significant irritability. As such, she notes she "barked" at several people at work. I had recommended she seek counseling, which she has chosen not to do. She does note today that she continues to have difficulties at work and now may be facing termination due to her irritable state. She does note that she has other sources of income, so she feels she would be financially stable were she to quit work.  Past Medical History: Patient Active Problem List   Diagnosis Date Noted  . Essential hypertension 04/30/2020  . History of polycythemia 02/02/2020  . Hyperlipidemia 02/02/2020  . Polycythemia 02/02/2020  . Multiple sclerosis (HCC) 06/07/2011   Past  Surgical History:  Procedure Laterality Date  . CARPAL TUNNEL RELEASE Right   . TONSILLECTOMY     Family History  Problem Relation Age of Onset  . Hypertension Mother   . Stroke Father   . Hypertension Father   . Allergic rhinitis Sister   . Multiple sclerosis Sister   . Angioedema Neg Hx   . Asthma Neg Hx   . Atopy Neg Hx   . Eczema Neg Hx   . Immunodeficiency Neg Hx   . Urticaria Neg Hx    Outpatient Medications Prior to Visit  Medication Sig Dispense Refill  . aspirin 81 MG chewable tablet Chew 81 mg by mouth daily.    . Cholecalciferol 50 MCG (2000 UT) CAPS Take 4,000 Units by mouth.    . Multiple Vitamin (MULTIVITAMIN) tablet Take 1 tablet by mouth daily.    Barbra Sarks PREFILLED 30 MCG/0.5ML PSKT injection Inject into the muscle.    Marland Kitchen Glatiramer Acetate (COPAXONE [place]) Inject into the skin daily. (Patient not taking: Reported on 04/30/2020)     No facility-administered medications prior to visit.   Allergies  Allergen Reactions  . Contrast Media [Iodinated Diagnostic Agents]     Per pt-- "dry mouth, affect and my salivary glands, I don't like it again"  . Copaxone [Glatiramer Acetate] Hives    Specific to this formulation. Now on Glatopa without hives.   Objective:   Today's Vitals   04/30/20 0831  BP: (!) 148/100  Pulse: 60  Temp: 97.8 F (36.6 C)  TempSrc: Temporal  SpO2: 98%  Weight: 167  lb 12.8 oz (76.1 kg)  Height: 5\' 5"  (1.651 m)   Body mass index is 27.92 kg/m.   General: Well developed, well nourished. No acute distress. Psych: Alert and oriented x3. Normal mood and affect.  Health Maintenance Due  Topic Date Due  . PAP SMEAR-Modifier  Never done  . COVID-19 Vaccine (3 - Booster for Moderna series) 10/30/2019     Assessment & Plan:   1. Multiple sclerosis (HCC) In light of having started on Avonex, we will check a CMP and CBC today. She will continue to work with her neurologist in regards to management of her MS.  - Comprehensive metabolic  panel  2. Essential hypertension Ms. Mancusi's blood pressures continue to be elevated, consistent with Stage 2 hypertension. I recommend we start combination therapy with an ACE-I and a diuretic to target a reduction of BP to >130/80. I will have her return in 1 month to reassess her BP control.  - lisinopril-hydrochlorothiazide (ZESTORETIC) 20-12.5 MG tablet; Take 1 tablet by mouth daily.  Dispense: 90 tablet; Refill: 3  3. Mixed hyperlipidemia Ms. Montini has elevated lipids. Her ACC/AHA 10-year risk of a major CV event is 5.5% (boderline risk). She chooses not to attempt other medications for her lipids and was not interested in referral to the [facility].  4. History of polycythemia Ms. Barco had an elevated RBC count on her last CBC (5.53 Mil/uL). This has been repeatedly high in the past. I recommended she return for a repeat draw when well hydrated. She confirms she drank 16 oz. of water this morning prior to arrival. We will check a CBC both to follow-up on this and to monitor for issues with her Avonex.  - CBC  [facility], MD

## 2020-05-03 ENCOUNTER — Ambulatory Visit: Payer: Managed Care, Other (non HMO) | Admitting: Family Medicine

## 2020-05-10 ENCOUNTER — Encounter: Payer: Self-pay | Admitting: Family Medicine

## 2020-05-10 NOTE — Telephone Encounter (Signed)
Spoke to patient and she states that she feels like it might be a reaction between the Avonex injection and BP medication. She states that it happens for 2 days after taking the Avonex.  She will wait to make an appt due to its getting better and if it happens again she will get an appt before the 27th.  patient agreeable to plan. Dm/cma

## 2020-05-27 ENCOUNTER — Other Ambulatory Visit: Payer: Self-pay

## 2020-05-28 ENCOUNTER — Ambulatory Visit: Payer: BC Managed Care – PPO | Admitting: Family Medicine

## 2020-05-28 ENCOUNTER — Encounter: Payer: Self-pay | Admitting: Family Medicine

## 2020-05-28 VITALS — BP 116/72 | HR 80 | Temp 97.9°F | Ht 65.0 in | Wt 164.4 lb

## 2020-05-28 DIAGNOSIS — I10 Essential (primary) hypertension: Secondary | ICD-10-CM

## 2020-05-28 DIAGNOSIS — Z862 Personal history of diseases of the blood and blood-forming organs and certain disorders involving the immune mechanism: Secondary | ICD-10-CM

## 2020-05-28 DIAGNOSIS — G35 Multiple sclerosis: Secondary | ICD-10-CM | POA: Diagnosis not present

## 2020-05-28 MED ORDER — LISINOPRIL-HYDROCHLOROTHIAZIDE 20-12.5 MG PO TABS: 0.5000 | ORAL_TABLET | Freq: Every day | ORAL | 3 refills | Status: DC

## 2020-05-28 NOTE — Progress Notes (Signed)
[facility] [address] [place] [address] Dept: [phone] Dept Fax: [phone]  Chronic Care Office Visit  Subjective:    Patient ID: Angela Monroe, female    DOB: 1960/02/27, 60 y.o..   MRN: [id number]  Chief Complaint  Patient presents with  . Follow-up    4 week f/u HTN.  Average BP 107/74.  Only taking 1/2 tablet of BP meds.      History of Present Illness:  Patient is in today for reassessment of chronic medical issues.  Ms. Rhinehart returns today for reassessment of her blood pressure. At her last visit, her blood pressure remained quite high at 148/100. I started ehr on lisinopril-HCTZ. She notes that in the first two weeks, she was having frequent bouts of orthostasis. She eventually decreased her medication to 1/2 pill a day. She still gets this a small amount, but finds it tolerable.  Ms. Rajagopalan has a history of MS. She continues to be on weekly injections of Avonex. She notes that she feels flu-like symptoms for 24-48 hours afterwards. She continues to follow with neurology regarding this.  Ms. Hooser has had repeat mild polycythemia on her CBCs. We repeated this at her last visit with hydration.  Past Medical History: Patient Active Problem List   Diagnosis Date Noted  . Essential hypertension 04/30/2020  . History of polycythemia 02/02/2020  . Hyperlipidemia 02/02/2020  . Polycythemia 02/02/2020  . Multiple sclerosis (HCC) 06/07/2011   Past Surgical History:  Procedure Laterality Date  . CARPAL TUNNEL RELEASE Right   . TONSILLECTOMY     Family History  Problem Relation Age of Onset  . Hypertension Mother   . Stroke Father   . Hypertension Father   . Allergic rhinitis Sister   . Multiple sclerosis Sister   . Angioedema Neg Hx   . Asthma Neg Hx   . Atopy Neg Hx   . Eczema Neg Hx   . Immunodeficiency Neg Hx   . Urticaria Neg Hx    Outpatient Medications Prior to Visit  Medication  Sig Dispense Refill  . AVONEX PREFILLED 30 MCG/0.5ML PSKT injection Inject into the muscle.    . Cholecalciferol 50 MCG (2000 UT) CAPS Take 4,000 Units by mouth.    . Multiple Vitamin (MULTIVITAMIN) tablet Take 1 tablet by mouth daily.    Marland Kitchen lisinopril-hydrochlorothiazide (ZESTORETIC) 20-12.5 MG tablet Take 1 tablet by mouth daily. (Patient taking differently: Take 1 tablet by mouth daily. Take 1/2 tablet daily) 90 tablet 3  . aspirin 81 MG chewable tablet Chew 81 mg by mouth daily. (Patient not taking: Reported on 05/28/2020)     No facility-administered medications prior to visit.   Allergies  Allergen Reactions  . Contrast Media [Iodinated Diagnostic Agents]     Per pt-- "dry mouth, affect and my salivary glands, I don't like it again"  . Copaxone [Glatiramer Acetate] Hives    Specific to this formulation. Now on Glatopa without hives.     Objective:   Today's Vitals   05/28/20 0807  BP: 116/72  Pulse: 80  Temp: 97.9 F (36.6 C)  TempSrc: Temporal  SpO2: 99%  Weight: 164 lb 6.4 oz (74.6 kg)  Height: 5\' 5"  (1.651 m)   Body mass index is 27.36 kg/m.   General: Well developed, well nourished. No acute distress. Psych: Alert and oriented x3. Normal mood and affect.  Health Maintenance Due  Topic Date Due  . PAP SMEAR-Modifier  Never done  . Zoster  Vaccines- Shingrix (1 of 2) Never done  . COVID-19 Vaccine (3 - Booster for Moderna series) 09/30/2019    Lab Results CBC Latest Ref Rng & Units 04/30/2020 02/02/2020 11/24/2015  WBC 4.0 - 10.5 K/uL 5.0 5.8 11.7(H)  Hemoglobin 12.0 - 15.0 g/dL 16.0(H) 16.0(H) 16.1(H)  Hematocrit 36.0 - 46.0 % 47.4(H) 47.3(H) 47.0(H)  Platelets 150.0 - 400.0 K/uL 219.0 261.0 240     Assessment & Plan:   1. Essential hypertension Ms. Rasmus's blood pressure is at goal. I agree with her action to decrease her Zestoretic to a 1/2 pill a day. If the orthostatic symptoms persist, we might try her on lisinopril monotherapy. I will reassess her in 3  months.  - lisinopril-hydrochlorothiazide (ZESTORETIC) 20-12.5 MG tablet; Take 0.5 tablets by mouth daily.  Dispense: 45 tablet; Refill: 3  2. History of polycythemia Stable. Ms. Posas notes that due to the long-standing nature of this and the midl elevation, she is not concerned and is not interested in pursuing this further at this time with a hematologist.  3. Multiple sclerosis (HCC) Stable on Avonex. Managing with side effects.  Loyola Mast, MD

## 2020-06-15 DIAGNOSIS — G35 Multiple sclerosis: Secondary | ICD-10-CM | POA: Diagnosis not present

## 2020-09-15 DIAGNOSIS — H40033 Anatomical narrow angle, bilateral: Secondary | ICD-10-CM | POA: Diagnosis not present

## 2020-09-15 DIAGNOSIS — H40013 Open angle with borderline findings, low risk, bilateral: Secondary | ICD-10-CM | POA: Diagnosis not present

## 2020-09-15 DIAGNOSIS — H2513 Age-related nuclear cataract, bilateral: Secondary | ICD-10-CM | POA: Diagnosis not present

## 2020-09-15 DIAGNOSIS — H04123 Dry eye syndrome of bilateral lacrimal glands: Secondary | ICD-10-CM | POA: Diagnosis not present

## 2021-01-05 ENCOUNTER — Encounter: Payer: Self-pay | Admitting: Family Medicine

## 2021-02-18 ENCOUNTER — Encounter: Payer: BC Managed Care – PPO | Admitting: Family Medicine

## 2021-04-23 ENCOUNTER — Other Ambulatory Visit: Payer: Self-pay | Admitting: Family Medicine

## 2021-04-23 DIAGNOSIS — I10 Essential (primary) hypertension: Secondary | ICD-10-CM

## 2022-04-08 ENCOUNTER — Encounter (HOSPITAL_COMMUNITY): Payer: Self-pay

## 2022-04-08 ENCOUNTER — Emergency Department (HOSPITAL_COMMUNITY): Payer: No Typology Code available for payment source

## 2022-04-08 ENCOUNTER — Other Ambulatory Visit: Payer: Self-pay

## 2022-04-08 ENCOUNTER — Inpatient Hospital Stay (HOSPITAL_COMMUNITY)
Admission: EM | Admit: 2022-04-08 | Discharge: 2022-04-11 | Disposition: A | Discharge status: Home / Self Care | Payer: No Typology Code available for payment source | Source: Home / Self Care | Attending: Internal Medicine | Admitting: Internal Medicine

## 2022-04-08 ENCOUNTER — Inpatient Hospital Stay (HOSPITAL_COMMUNITY)
Admission: EM | Admit: 2022-04-08 | Discharge: 2022-04-08 | DRG: 419 | Disposition: A | Discharge status: Home / Self Care | Payer: No Typology Code available for payment source | Attending: Internal Medicine | Admitting: Internal Medicine

## 2022-04-08 DIAGNOSIS — R1011 Right upper quadrant pain: Secondary | ICD-10-CM | POA: Diagnosis not present

## 2022-04-08 DIAGNOSIS — K831 Obstruction of bile duct: Secondary | ICD-10-CM | POA: Diagnosis present

## 2022-04-08 DIAGNOSIS — K8067 Calculus of gallbladder and bile duct with acute and chronic cholecystitis with obstruction: Secondary | ICD-10-CM | POA: Diagnosis present

## 2022-04-08 DIAGNOSIS — Z820 Family history of epilepsy and other diseases of the nervous system: Secondary | ICD-10-CM

## 2022-04-08 DIAGNOSIS — Z91041 Radiographic dye allergy status: Secondary | ICD-10-CM

## 2022-04-08 DIAGNOSIS — K8021 Calculus of gallbladder without cholecystitis with obstruction: Secondary | ICD-10-CM

## 2022-04-08 DIAGNOSIS — Z823 Family history of stroke: Secondary | ICD-10-CM

## 2022-04-08 DIAGNOSIS — E785 Hyperlipidemia, unspecified: Secondary | ICD-10-CM | POA: Diagnosis present

## 2022-04-08 DIAGNOSIS — K819 Cholecystitis, unspecified: Secondary | ICD-10-CM | POA: Diagnosis not present

## 2022-04-08 DIAGNOSIS — G35 Multiple sclerosis: Secondary | ICD-10-CM | POA: Diagnosis present

## 2022-04-08 DIAGNOSIS — Z79899 Other long term (current) drug therapy: Secondary | ICD-10-CM

## 2022-04-08 DIAGNOSIS — K8070 Calculus of gallbladder and bile duct without cholecystitis without obstruction: Principal | ICD-10-CM

## 2022-04-08 DIAGNOSIS — K8050 Calculus of bile duct without cholangitis or cholecystitis without obstruction: Secondary | ICD-10-CM

## 2022-04-08 DIAGNOSIS — Z5986 Financial insecurity: Secondary | ICD-10-CM

## 2022-04-08 DIAGNOSIS — Z5329 Procedure and treatment not carried out because of patient's decision for other reasons: Secondary | ICD-10-CM | POA: Diagnosis present

## 2022-04-08 DIAGNOSIS — K810 Acute cholecystitis: Secondary | ICD-10-CM | POA: Diagnosis present

## 2022-04-08 DIAGNOSIS — I10 Essential (primary) hypertension: Secondary | ICD-10-CM | POA: Diagnosis present

## 2022-04-08 DIAGNOSIS — K8020 Calculus of gallbladder without cholecystitis without obstruction: Secondary | ICD-10-CM | POA: Insufficient documentation

## 2022-04-08 DIAGNOSIS — Z8249 Family history of ischemic heart disease and other diseases of the circulatory system: Secondary | ICD-10-CM

## 2022-04-08 DIAGNOSIS — K760 Fatty (change of) liver, not elsewhere classified: Secondary | ICD-10-CM | POA: Diagnosis present

## 2022-04-08 DIAGNOSIS — D751 Secondary polycythemia: Secondary | ICD-10-CM | POA: Diagnosis present

## 2022-04-08 DIAGNOSIS — Z8669 Personal history of other diseases of the nervous system and sense organs: Secondary | ICD-10-CM | POA: Diagnosis not present

## 2022-04-08 DIAGNOSIS — Z888 Allergy status to other drugs, medicaments and biological substances status: Secondary | ICD-10-CM | POA: Diagnosis not present

## 2022-04-08 DIAGNOSIS — K8063 Calculus of gallbladder and bile duct with acute cholecystitis with obstruction: Principal | ICD-10-CM | POA: Diagnosis present

## 2022-04-08 DIAGNOSIS — R945 Abnormal results of liver function studies: Secondary | ICD-10-CM | POA: Diagnosis not present

## 2022-04-08 DIAGNOSIS — K812 Acute cholecystitis with chronic cholecystitis: Secondary | ICD-10-CM | POA: Diagnosis not present

## 2022-04-08 DIAGNOSIS — R7989 Other specified abnormal findings of blood chemistry: Secondary | ICD-10-CM | POA: Diagnosis not present

## 2022-04-08 LAB — CBC WITH DIFFERENTIAL/PLATELET
Abs Immature Granulocytes: 0.06 10*3/uL (ref 0.00–0.07)
Abs Immature Granulocytes: 0.05 10*3/uL (ref 0.00–0.07)
Basophils Absolute: 0 10*3/uL (ref 0.0–0.1)
Basophils Absolute: 0 10*3/uL (ref 0.0–0.1)
Basophils Relative: 0 %
Basophils Relative: 0 %
Eosinophils Absolute: 0 10*3/uL (ref 0.0–0.5)
Eosinophils Absolute: 0 10*3/uL (ref 0.0–0.5)
Eosinophils Relative: 0 %
Eosinophils Relative: 0 %
HCT: 42.5 % (ref 36.0–46.0)
HCT: 43.2 % (ref 36.0–46.0)
Hemoglobin: 14.9 g/dL (ref 12.0–15.0)
Hemoglobin: 14.5 g/dL (ref 12.0–15.0)
Immature Granulocytes: 0 %
Immature Granulocytes: 1 %
Lymphocytes Relative: 11 %
Lymphocytes Relative: 8 %
Lymphs Abs: 1.4 10*3/uL (ref 0.7–4.0)
Lymphs Abs: 0.9 10*3/uL (ref 0.7–4.0)
MCH: 30.3 pg (ref 26.0–34.0)
MCH: 29.5 pg (ref 26.0–34.0)
MCHC: 35.1 g/dL (ref 30.0–36.0)
MCHC: 33.6 g/dL (ref 30.0–36.0)
MCV: 86.6 fL (ref 80.0–100.0)
MCV: 87.8 fL (ref 80.0–100.0)
Monocytes Absolute: 0.7 10*3/uL (ref 0.1–1.0)
Monocytes Absolute: 0.9 10*3/uL (ref 0.1–1.0)
Monocytes Relative: 5 %
Monocytes Relative: 8 %
Neutro Abs: 11.1 10*3/uL — ABNORMAL HIGH (ref 1.7–7.7)
Neutro Abs: 8.9 10*3/uL — ABNORMAL HIGH (ref 1.7–7.7)
Neutrophils Relative %: 84 %
Neutrophils Relative %: 83 %
Platelets: 212 10*3/uL (ref 150–400)
Platelets: 198 10*3/uL (ref 150–400)
RBC: 4.91 MIL/uL (ref 3.87–5.11)
RBC: 4.92 MIL/uL (ref 3.87–5.11)
RDW: 13 % (ref 11.5–15.5)
RDW: 13.1 % (ref 11.5–15.5)
WBC: 13.3 10*3/uL — ABNORMAL HIGH (ref 4.0–10.5)
WBC: 10.8 10*3/uL — ABNORMAL HIGH (ref 4.0–10.5)
nRBC: 0 % (ref 0.0–0.2)
nRBC: 0 % (ref 0.0–0.2)

## 2022-04-08 LAB — COMPREHENSIVE METABOLIC PANEL
ALT: 41 U/L (ref 0–44)
ALT: 187 U/L — ABNORMAL HIGH (ref 0–44)
AST: 72 U/L — ABNORMAL HIGH (ref 15–41)
AST: 280 U/L — ABNORMAL HIGH (ref 15–41)
Albumin: 4 g/dL (ref 3.5–5.0)
Albumin: 3.8 g/dL (ref 3.5–5.0)
Alkaline Phosphatase: 48 U/L (ref 38–126)
Alkaline Phosphatase: 56 U/L (ref 38–126)
Anion gap: 9 (ref 5–15)
Anion gap: 10 (ref 5–15)
BUN: 15 mg/dL (ref 8–23)
BUN: 11 mg/dL (ref 8–23)
CO2: 23 mmol/L (ref 22–32)
CO2: 25 mmol/L (ref 22–32)
Calcium: 9.5 mg/dL (ref 8.9–10.3)
Calcium: 9.6 mg/dL (ref 8.9–10.3)
Chloride: 106 mmol/L (ref 98–111)
Chloride: 104 mmol/L (ref 98–111)
Creatinine, Ser: 0.83 mg/dL (ref 0.44–1.00)
Creatinine, Ser: 0.94 mg/dL (ref 0.44–1.00)
GFR, Estimated: >60 mL/min (ref >60)
GFR, Estimated: >60 mL/min (ref >60)
Glucose, Bld: 137 mg/dL — ABNORMAL HIGH (ref 70–99)
Glucose, Bld: 122 mg/dL — ABNORMAL HIGH (ref 70–99)
Potassium: 3.5 mmol/L (ref 3.5–5.1)
Potassium: 4 mmol/L (ref 3.5–5.1)
Sodium: 138 mmol/L (ref 135–145)
Sodium: 139 mmol/L (ref 135–145)
Total Bilirubin: 1.7 mg/dL — ABNORMAL HIGH (ref 0.3–1.2)
Total Bilirubin: 3.5 mg/dL — ABNORMAL HIGH (ref 0.3–1.2)
Total Protein: 7.2 g/dL (ref 6.5–8.1)
Total Protein: 7.1 g/dL (ref 6.5–8.1)

## 2022-04-08 LAB — URINALYSIS, ROUTINE W REFLEX MICROSCOPIC
Bilirubin Urine: NEGATIVE
Glucose, UA: NEGATIVE mg/dL
Hgb urine dipstick: NEGATIVE
Ketones, ur: 5 mg/dL — AB
Leukocytes,Ua: NEGATIVE
Nitrite: NEGATIVE
Protein, ur: NEGATIVE mg/dL
Specific Gravity, Urine: 1.018 (ref 1.005–1.030)
pH: 5 (ref 5.0–8.0)

## 2022-04-08 LAB — BILIRUBIN, DIRECT: Bilirubin, Direct: 1.7 mg/dL — ABNORMAL HIGH (ref 0.0–0.2)

## 2022-04-08 LAB — LIPASE, BLOOD: Lipase: 39 U/L (ref 11–51)

## 2022-04-08 LAB — MAGNESIUM: Magnesium: 2 mg/dL (ref 1.7–2.4)

## 2022-04-08 MED ORDER — MORPHINE SULFATE (PF) 4 MG/ML IV SOLN
4.0000 mg | Freq: Once | INTRAVENOUS | Status: AC
  Administered 2022-04-08: 4 mg via INTRAVENOUS
  Filled 2022-04-08: qty 1

## 2022-04-08 MED ORDER — SODIUM CHLORIDE 0.9 % IV SOLN
2.0000 g | Freq: Once | INTRAVENOUS | Status: AC
  Administered 2022-04-08: 2 g via INTRAVENOUS
  Filled 2022-04-08: qty 20

## 2022-04-08 MED ORDER — ONDANSETRON HCL 4 MG/2ML IJ SOLN: 4.0000 mg | Freq: Four times a day (QID) | INTRAMUSCULAR | Status: DC | PRN

## 2022-04-08 MED ORDER — SODIUM CHLORIDE 0.9 % IV SOLN: INTRAVENOUS | Status: DC

## 2022-04-08 MED ORDER — HYDRALAZINE HCL 20 MG/ML IJ SOLN: 10.0000 mg | Freq: Four times a day (QID) | INTRAMUSCULAR | Status: DC | PRN

## 2022-04-08 MED ORDER — SODIUM CHLORIDE 0.9 % IV BOLUS
500.0000 mL | Freq: Once | INTRAVENOUS | Status: AC
  Administered 2022-04-08: 500 mL via INTRAVENOUS

## 2022-04-08 MED ORDER — ACETAMINOPHEN 325 MG PO TABS: 650.0000 mg | ORAL_TABLET | Freq: Four times a day (QID) | ORAL | Status: DC | PRN

## 2022-04-08 MED ORDER — NALOXONE HCL 0.4 MG/ML IJ SOLN: 0.4000 mg | INTRAMUSCULAR | Status: DC | PRN

## 2022-04-08 MED ORDER — HYDROCHLOROTHIAZIDE 12.5 MG PO TABS
6.2500 mg | ORAL_TABLET | Freq: Every day | ORAL | Status: DC
  Administered 2022-04-09 – 2022-04-11 (×3): 6.25 mg via ORAL
  Filled 2022-04-08 (×3): qty 1

## 2022-04-08 MED ORDER — LISINOPRIL-HYDROCHLOROTHIAZIDE 20-12.5 MG PO TABS: 0.5000 | ORAL_TABLET | Freq: Every day | ORAL | Status: DC

## 2022-04-08 MED ORDER — FENTANYL CITRATE PF 50 MCG/ML IJ SOSY
25.0000 ug | PREFILLED_SYRINGE | INTRAMUSCULAR | Status: DC | PRN
  Administered 2022-04-08: 25 ug via INTRAVENOUS
  Filled 2022-04-08: qty 1

## 2022-04-08 MED ORDER — MELATONIN 3 MG PO TABS: 3.0000 mg | ORAL_TABLET | Freq: Every evening | ORAL | Status: DC | PRN

## 2022-04-08 MED ORDER — ONDANSETRON HCL 4 MG/2ML IJ SOLN
4.0000 mg | Freq: Once | INTRAMUSCULAR | Status: AC
  Administered 2022-04-08: 4 mg via INTRAVENOUS
  Filled 2022-04-08: qty 2

## 2022-04-08 MED ORDER — SODIUM CHLORIDE 0.9 % IV BOLUS
1000.0000 mL | Freq: Once | INTRAVENOUS | Status: AC
  Administered 2022-04-08: 1000 mL via INTRAVENOUS

## 2022-04-08 MED ORDER — LISINOPRIL 20 MG PO TABS
10.0000 mg | ORAL_TABLET | Freq: Every day | ORAL | Status: DC
  Administered 2022-04-09 – 2022-04-11 (×3): 10 mg via ORAL
  Filled 2022-04-08 (×3): qty 1

## 2022-04-08 MED ORDER — ACETAMINOPHEN 650 MG RE SUPP: 650.0000 mg | Freq: Four times a day (QID) | RECTAL | Status: DC | PRN

## 2022-04-08 MED ORDER — MORPHINE SULFATE (PF) 2 MG/ML IV SOLN
2.0000 mg | INTRAVENOUS | Status: DC | PRN
  Administered 2022-04-08 – 2022-04-10 (×2): 2 mg via INTRAVENOUS
  Filled 2022-04-08 (×2): qty 1

## 2022-04-08 MED ORDER — INTERFERON BETA-1A 30 MCG/0.5ML IM PSKT
30.0000 ug | PREFILLED_SYRINGE | INTRAMUSCULAR | Status: DC
  Filled 2022-04-08: qty 0.5

## 2022-04-08 NOTE — H&P (Addendum)
[id number] AvLoretha Stapleron City Co49 MKoreaElliot GurneyentuMs State Hospitalporationutpatient Surgery Center LLCMarylene LandNew YCarleene Overl11223 pora46mionColumb t GurneyShriners Hospital For Children-PortlandKoreaGI CorporationKentucky56[name][address]8[address]New YorkMercy Hospital - Mercy Hospital [facility]chard [facility] Division99f [id number] Overliet Francis Healthcare CampusMarylene Monroe Murrieta57mElliot GurneyGunnison Valley HospitalKoreaUGI CorporationKentucky11[name][address]8[address]56New YorkCopper Queen Community Hospital64f [id number] OverliePavonia Surgery Center IncMarylene Monroe McGregor57mElliot GurneyTrinity Hospital Of AugustaKoreaUGI CorporationKentucky5[name][address]8[address]New YorkCli Surgery Center61f [id number] OverlieSun Behavioral ColumbusMarylene Monroe Shellytown64mElliot GurneyIndiana University HealthKoreaUGI CorporationKentucky57[name][address]New YorkPerry Community Hospital13f [id number] OverlieSt Anthony'S Rehabilitation HospitalMarylene Monroe Fort Pierce South71mElliot GurneyDaybreak Of SpokaneKorea  rubs / gallops. No extremity edema.  Abdomen: no tenderness, [name], [name], lesions, [name], physical exam findings, [name], [name], [name], DO 04/08/2022 8:04 PM  For on call review  [email or web address].

## 2022-04-08 NOTE — ED Notes (Signed)
ED TO INPATIENT HANDOFF REPORT  ED Nurse Name and Phone #:  Cheral Almas, RN [phone]  S Name/Age/Gender Angela Monroe 62 y.o. female Room/Bed: H018C/H018C  Code Status   Code Status: Full Code  Home/SNF/Other Home Patient oriented to: self, place, time, and situation Is this baseline? Yes   Triage Complete: Triage complete  Chief Complaint RUQ pain [R10.11]  Triage Note BIB EMS/ pt c/o upper ABD pain starting Thursday night w/ nausea/ hx of gallstones/ EMS reports pos. Murphy sign/ pain radiates to upper back/ pt A&Ox4/ Vitals stable    Allergies Allergies  Allergen Reactions   Contrast Media [Iodinated Contrast Media]     Per pt-- "dry mouth, affect and my salivary glands, I don't like it again"   Copaxone [Glatiramer Acetate] Hives    Specific to this formulation. Now on Glatopa without hives.    Level of Care/Admitting Diagnosis ED Disposition     ED Disposition  Admit   Condition  --   Comment  [facility]ospital [facility] [[id number]]  Level of Care: Telemetry Medical [104]  May admit patient to [name] or [facility] if equivalent level of care is available:: No  Covid Evaluation: Asymptomatic - no recent exposure (last 10 days) testing not required  Diagnosis: RUQ pain [251471]  Admitting Physician: Angie Fava [[id number]]  Attending Physician: Angie Fava [[id number]]  Certification:: I certify this patient will need inpatient services for at least 2 midnights  Estimated Length of Stay: 2          B Medical/Surgery History Past Medical History:  Diagnosis Date   MS (multiple sclerosis)    Past Surgical History:  Procedure Laterality Date   CARPAL TUNNEL RELEASE Right    TONSILLECTOMY       A IV Location/Drains/Wounds Patient Lines/Drains/Airways Status     Active Line/Drains/Airways     Name Placement date Placement time Site Days   Peripheral IV 04/08/22 20 G Left Antecubital 04/08/22  0000   Antecubital  less than 1            Intake/Output Last 24 hours No intake or output data in the 24 hours ending 04/08/22 0450  Labs/Imaging Results for orders placed or performed during the hospital encounter of 04/08/22 (from the past 48 hour(s))  Comprehensive metabolic panel     Status: Abnormal   Collection Time: 04/08/22 12:52 AM  Result Value Ref Range   Sodium 138 135 - 145 mmol/L   Potassium 3.5 3.5 - 5.1 mmol/L   Chloride 106 98 - 111 mmol/L   CO2 23 22 - 32 mmol/L   Glucose, Bld 137 (H) 70 - 99 mg/dL    Comment: Glucose reference range applies only to samples taken after fasting for at least 8 hours.   BUN 15 8 - 23 mg/dL   Creatinine, Ser 1.32 0.44 - 1.00 mg/dL   Calcium 9.5 8.9 - 44.0 mg/dL   Total Protein 7.2 6.5 - 8.1 g/dL   Albumin 4.0 3.5 - 5.0 g/dL   AST 72 (H) 15 - 41 U/L   ALT 41 0 - 44 U/L   Alkaline Phosphatase 48 38 - 126 U/L   Total Bilirubin 1.7 (H) 0.3 - 1.2 mg/dL   GFR, Estimated >10 >27 mL/min    Comment: (NOTE) Calculated using the CKD-EPI Creatinine Equation (2021)    Anion gap 9 5 - 15    Comment: Performed at [facility], [address], [place],  [place] [address]  CBC with Differential     Status: Abnormal   Collection Time: 04/08/22 12:52 AM  Result Value Ref Range   WBC 13.3 (H) 4.0 - 10.5 K/uL   RBC 4.91 3.87 - 5.11 MIL/uL   Hemoglobin 14.9 12.0 - 15.0 g/dL   HCT 80.0 34.9 - 17.9 %   MCV 86.6 80.0 - 100.0 fL   MCH 30.3 26.0 - 34.0 pg   MCHC 35.1 30.0 - 36.0 g/dL   RDW 15.0 56.9 - 79.4 %   Platelets 212 150 - 400 K/uL   nRBC 0.0 0.0 - 0.2 %   Neutrophils Relative % 84 %   Neutro Abs 11.1 (H) 1.7 - 7.7 K/uL   Lymphocytes Relative 11 %   Lymphs Abs 1.4 0.7 - 4.0 K/uL   Monocytes Relative 5 %   Monocytes Absolute 0.7 0.1 - 1.0 K/uL   Eosinophils Relative 0 %   Eosinophils Absolute 0.0 0.0 - 0.5 K/uL   Basophils Relative 0 %   Basophils Absolute 0.0 0.0 - 0.1 K/uL   Immature Granulocytes 0 %   Abs Immature Granulocytes  0.06 0.00 - 0.07 K/uL    Comment: Performed at [facility], [address] [place], [place] [address]  Lipase, blood     Status: None   Collection Time: 04/08/22 12:52 AM  Result Value Ref Range   Lipase 39 11 - 51 U/L    Comment: Performed at [facility], [address], [place], [place] [address]  Urinalysis, Routine w reflex microscopic -Urine, Clean Catch     Status: Abnormal   Collection Time: 04/08/22  2:38 AM  Result Value Ref Range   Color, Urine YELLOW YELLOW   APPearance HAZY (A) CLEAR   Specific Gravity, Urine 1.018 1.005 - 1.030   pH 5.0 5.0 - 8.0   Glucose, UA NEGATIVE NEGATIVE mg/dL   Hgb urine dipstick NEGATIVE NEGATIVE   Bilirubin Urine NEGATIVE NEGATIVE   Ketones, ur 5 (A) NEGATIVE mg/dL   Protein, ur NEGATIVE NEGATIVE mg/dL   Nitrite NEGATIVE NEGATIVE   Leukocytes,Ua NEGATIVE NEGATIVE    Comment: Performed at [facility], [address], [place], [place] [address]   US Abdomen Limited RUQ (LIVER/GB)  Result Date: 04/08/2022 CLINICAL DATA:  Right upper quadrant pain. EXAM: ULTRASOUND ABDOMEN LIMITED RIGHT UPPER QUADRANT COMPARISON:  None Available. FINDINGS: Gallbladder: A 1.5 cm shadowing echogenic gallstone is seen within the dependent portion of the gallbladder lumen. The gallbladder wall measures 2.9 mm in thickness. No sonographic Murphy sign noted by sonographer. Common bile duct: Diameter: 12.0 mm Liver: No focal lesion identified. Diffusely increased echogenicity of the liver parenchyma is noted. Portal vein is patent on color Doppler imaging with normal direction of blood flow towards the liver. Other: None. IMPRESSION: 1. Cholelithiasis, without evidence of acute cholecystitis. 2. Dilated common bile duct without visualized obstructing lesion. Further evaluation with MRCP is recommended. 3. 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 Electronically Signed   By: Aram Candela M.D.   On: 04/08/2022 02:23   DG Chest 2 View  Result Date:  04/08/2022 CLINICAL DATA:  Chest tightness, upper abdominal pain EXAM: CHEST - 2 VIEW COMPARISON:  None Available. FINDINGS: Cardiac contours at the upper limit of normal. No focal pulmonary opacity. No pleural effusion or pneumothorax. No acute osseous abnormality. IMPRESSION: No acute cardiopulmonary process. Electronically Signed   By: Wiliam Ke M.D.   On: 04/08/2022 01:16    Pending Labs Unresulted  Labs (From admission, onward)     Start     Ordered   04/08/22 0500  CBC with Differential/Platelet  Tomorrow morning,   R        04/08/22 0335   04/08/22 0500  Comprehensive metabolic panel  Tomorrow morning,   R        04/08/22 0335   04/08/22 0500  Magnesium  Tomorrow morning,   R        04/08/22 0335   04/08/22 0500  Bilirubin, direct  Tomorrow morning,   R        04/08/22 0335            Vitals/Pain Today's Vitals   04/08/22 0024 04/08/22 0026 04/08/22 0155 04/08/22 0449  BP:      Pulse:      Resp:      Temp:    98.3 F (36.8 C)  TempSrc:    Oral  SpO2: 99%     Weight:  73.9 kg    PainSc:  8  0-No pain     Isolation Precautions No active isolations  Medications Medications  acetaminophen (TYLENOL) tablet 650 mg (has no administration in time range)    Or  acetaminophen (TYLENOL) suppository 650 mg (has no administration in time range)  melatonin tablet 3 mg (has no administration in time range)  ondansetron (ZOFRAN) injection 4 mg (has no administration in time range)  naloxone (NARCAN) injection 0.4 mg (has no administration in time range)  fentaNYL (SUBLIMAZE) injection 25 mcg (has no administration in time range)  sodium chloride 0.9 % bolus 500 mL (0 mLs Intravenous Stopped 04/08/22 0239)  morphine (PF) 4 MG/ML injection 4 mg (4 mg Intravenous Given 04/08/22 0115)  ondansetron (ZOFRAN) injection 4 mg (4 mg Intravenous Given 04/08/22 0115)  cefTRIAXone (ROCEPHIN) 2 g in sodium chloride 0.9 % 100 mL IVPB (0 g Intravenous Stopped 04/08/22 0426)    Mobility walks      Focused Assessments Gastro- upper ABD w/ nausea/ hx of gallstones    R Recommendations: See Admitting Provider Note  Report given to:   Additional Notes:

## 2022-04-08 NOTE — ED Triage Notes (Signed)
Pt states she left AMA this morning, because she wasn't sure if the test would be covered and she needed to go home and get her paperwork to find out if the test was covered.

## 2022-04-08 NOTE — Assessment & Plan Note (Addendum)
-  On Avonex injection. Patient is due for injection today but will need to hold due to acute liver injury.

## 2022-04-08 NOTE — ED Notes (Signed)
Pt recently seen in ED for abd pain and was diagnosed with gallbladder issues. Pt states she had to leave AMA because she was unsure if the tests needed for admission were covered by insurance. Pt states she does not have any abd pain at this time but has not eaten or had anything to drink in over a day.

## 2022-04-08 NOTE — H&P (Addendum)
History and Physical    Patient: Angela Monroe DOB: May 08, 1960 DOA: 04/08/2022 DOS: the patient was seen and examined on 04/08/2022 PCP: Quita Skye, PA-C  Patient coming from: Home  Chief Complaint:  Chief Complaint  Patient presents with   Abdominal Pain   HPI: Angela Monroe is a 62 y.o. female with medical history significant of  multiple sclerosis on Avonex, hypertension presented to hospital with abdominal pain for 3 days prior to presentation with nausea and abdominal pain which was acute in onset after her dinner.  Patient does have history of gallstones.  She also had 1 episode of vomiting.  Denies any shortness of breath, chest pain, fever, chills or rigor.  Denies any urinary urgency, frequency or dysuria.  Denies any recent travel or sick contacts.  Denies any history of ulceration but has history of gallstones almost 8 years back.  In the ED, initial labs showed total bilirubin elevated at 1.7 with AST of 72 WBC was elevated at 13.3.  Urinalysis showed some ketones.  EKG showed normal sinus rhythm.  Ultrasound of the right upper quadrant showed cholelithiasis without cholecystitis but dilated common bile duct.  Chest x-ray was negative for acute findings.  In the ED patient received Tylenol, and Zofran, fentanyl, morphine and and was admitted hospital for further evaluation and treatment.  Assessment and plan.  Right upper quadrant pain nausea vomiting.  History of gallstones elevated LFT.  Ultrasound showing cholelithiasis without cholecystitis.  Mild elevation of LFTs on presentation.  Has slightly trended down.  CBD dilation noted on the ultrasound.  MRCP has been ordered but patient stating financial issues with it.  Communicative with general surgery for consultation..  Currently  IV fluids analgesics and symptomatic care.  Start on clears.  Trend LFTs in AM.  History of hypertension Patient is on lisinopril hydrochlorothiazide as outpatient.  Currently on  hold.  Will put the patient on as needed hydralazine.  History of multiple sclerosis on Avenox   Review of Systems: As mentioned in the history of present illness. All other systems reviewed and are negative. Past Medical History:  Diagnosis Date   MS (multiple sclerosis)    Past Surgical History:  Procedure Laterality Date   CARPAL TUNNEL RELEASE Right    TONSILLECTOMY     Social History:  reports that she has never smoked. She has never used smokeless tobacco. She reports that she does not drink alcohol and does not use drugs.  Allergies  Allergen Reactions   Contrast Media [Iodinated Contrast Media]     Per pt-- "dry mouth, affect and my salivary glands, I don't like it again"   Copaxone [Glatiramer Acetate] Hives    Specific to this formulation. Now on Glatopa without hives.    Family History  Problem Relation Age of Onset   Hypertension Mother    Stroke Father    Hypertension Father    Allergic rhinitis Sister    Multiple sclerosis Sister    Angioedema Neg Hx    Asthma Neg Hx    Atopy Neg Hx    Eczema Neg Hx    Immunodeficiency Neg Hx    Urticaria Neg Hx     Prior to Admission medications   Medication Sig Start Date End Date Taking? Authorizing Provider  AVONEX PREFILLED 30 MCG/0.5ML PSKT injection Inject into the muscle. 02/03/20   [provider]  Cholecalciferol 50 MCG (2000 UT) CAPS Take 4,000 Units by mouth.    [provider]  lisinopril-hydrochlorothiazide (  ZESTORETIC) 20-12.5 MG tablet Take 0.5 tablets by mouth daily. 04/24/21   Loyola Mast, MD  Multiple Vitamin (MULTIVITAMIN) tablet Take 1 tablet by mouth daily.    [provider]    Physical Exam: Vitals:   04/08/22 0024 04/08/22 0026 04/08/22 0449 04/08/22 0530  BP:    (!) 140/78  Pulse:    77  Resp:    15  Temp:   98.3 F (36.8 C) 98.5 F (36.9 C)  TempSrc:   Oral Oral  SpO2: 99%   97%  Weight:  73.9 kg     General:  Average built, not in obvious distress HENT:    No scleral pallor or icterus noted. Oral mucosa is moist.  Chest:  Clear breath sounds.  Diminished breath sounds bilaterally. No crackles or wheezes.  CVS: S1 &S2 heard. No murmur.  Regular rate and rhythm. Abdomen: Soft, nontender, nondistended.  Bowel sounds are heard.   Extremities: No cyanosis, clubbing or edema.  Peripheral pulses are palpable. Psych: Alert, awake and oriented, normal mood CNS:  No cranial nerve deficits.  Power equal in all extremities.   Skin: Warm and dry.  No rashes noted.  Data Reviewed:     Latest Ref Rng & Units 04/08/2022   12:52 AM 04/30/2020    9:11 AM 02/02/2020    9:10 AM  CMP  Glucose 70 - 99 mg/dL 403  754  360   BUN 8 - 23 mg/dL 15  11  14    Creatinine 0.44 - 1.00 mg/dL 6.77  0.34  0.35   Sodium 135 - 145 mmol/L 138  140  139   Potassium 3.5 - 5.1 mmol/L 3.5  4.5  4.0   Chloride 98 - 111 mmol/L 106  103  103   CO2 22 - 32 mmol/L 23  30  31    Calcium 8.9 - 10.3 mg/dL 9.5  24.8  18.5   Total Protein 6.5 - 8.1 g/dL 7.2  8.0  7.7   Total Bilirubin 0.3 - 1.2 mg/dL 1.7  0.9  0.6   Alkaline Phos 38 - 126 U/L 48  51  61   AST 15 - 41 U/L 72  20  16   ALT 0 - 44 U/L 41  22  15        Latest Ref Rng & Units 04/08/2022   12:52 AM 04/30/2020    9:11 AM 02/02/2020    9:10 AM  CBC  WBC 4.0 - 10.5 K/uL 13.3  5.0  5.8   Hemoglobin 12.0 - 15.0 g/dL 90.9  31.1  21.6   Hematocrit 36.0 - 46.0 % 42.5  47.4  47.3   Platelets 150 - 400 K/uL 212  219.0  261.0       Advance Care Planning:   Code Status: Full Code   Consults: General surgery  Family Communication: None at bedside  Severity of Illness: The appropriate patient status for this patient is INPATIENT. Inpatient status is judged to be reasonable and necessary in order to provide the required intensity of service to ensure the patient's safety. The patient's presenting symptoms, physical exam findings, and initial radiographic and laboratory data in the context of their chronic comorbidities is felt to  place them at high risk for further clinical deterioration. Furthermore, it is not anticipated that the patient will be medically stable for discharge from the hospital within 2 midnights of admission.  I certify that at the point of admission it is my clinical judgment  that the patient will require inpatient hospital care spanning beyond 2 midnights from the point of admission due to high intensity of service, high risk for further deterioration and high frequency of surveillance required.  Author: Joycelyn DasLaxman Travarus Trudo, MD 04/08/2022 7:28 AM  For on call review [email or web address].

## 2022-04-08 NOTE — Consult Note (Signed)
Consulting Physician: Hyman Hopes Vinita Prentiss  Referring Provider: Dr. Tyson Babinski  Chief Complaint: Abdominal pain  Reason for Consult: Choledocholithiasis   Subjective   HPI: Angela Monroe is an 62 y.o. female who is here for abdominal pain.  She has pain that starts about 3 hours after eating.  It has been going on for some time now.  I happened multiple times this week.  She presented to the emergency room and was admitted to the medicine service.  She is concerned about a text message she received from [facility] health regarding a emergency room bill.  She is worried about being out of network.  Past Medical History:  Diagnosis Date   MS (multiple sclerosis)     Past Surgical History:  Procedure Laterality Date   CARPAL TUNNEL RELEASE Right    TONSILLECTOMY      Family History  Problem Relation Age of Onset   Hypertension Mother    Stroke Father    Hypertension Father    Allergic rhinitis Sister    Multiple sclerosis Sister    Angioedema Neg Hx    Asthma Neg Hx    Atopy Neg Hx    Eczema Neg Hx    Immunodeficiency Neg Hx    Urticaria Neg Hx     Social:  reports that she has never smoked. She has never used smokeless tobacco. She reports that she does not drink alcohol and does not use drugs.  Allergies:  Allergies  Allergen Reactions   Contrast Media [Iodinated Contrast Media]     Per pt-- "dry mouth, affect and my salivary glands, I don't like it again"   Copaxone [Glatiramer Acetate] Hives    Specific to this formulation. Now on Glatopa without hives.    Medications: Current Outpatient Medications  Medication Instructions   Avonex Prefilled 30 mcg, Intramuscular, Weekly   Cholecalciferol 4,000 Units, Oral, Daily   lisinopril-hydrochlorothiazide (ZESTORETIC) 20-12.5 MG tablet 0.5 tablets, Oral, Daily   Multiple Vitamin (MULTIVITAMIN) tablet 1 tablet, Oral, Daily    ROS - all of the below systems have been reviewed with the patient and positives are indicated  with bold text General: chills, fever or night sweats Eyes: blurry vision or double vision ENT: epistaxis or sore throat Allergy/Immunology: itchy/watery eyes or nasal congestion Hematologic/Lymphatic: bleeding problems, blood clots or swollen lymph nodes Endocrine: temperature intolerance or unexpected weight changes Breast: new or changing breast lumps or nipple discharge Resp: cough, shortness of breath, or wheezing CV: chest pain or dyspnea on exertion GI: as per HPI GU: dysuria, trouble voiding, or hematuria MSK: joint pain or joint stiffness Neuro: TIA or stroke symptoms Derm: pruritus and skin lesion changes Psych: anxiety and depression  Objective   PE Blood pressure 129/79, pulse 79, temperature 99.3 F (37.4 C), temperature source Oral, resp. rate 16, weight 73.9 kg, last menstrual period 10/13/2011, SpO2 99 %. Constitutional: NAD; conversant; no deformities Eyes: Moist conjunctiva; no lid lag; anicteric; PERRL Neck: Trachea midline; no thyromegaly Lungs: Normal respiratory effort; no tactile fremitus CV: RRR; no palpable thrills; no pitting edema GI: Abd soft, tender right upper quadrant; no palpable hepatosplenomegaly MSK: Normal range of motion of extremities; no clubbing/cyanosis Psychiatric: Appropriate affect; alert and oriented x3 Lymphatic: No palpable cervical or axillary lymphadenopathy  Results for orders placed or performed during the hospital encounter of 04/08/22 (from the past 24 hour(s))  Comprehensive metabolic panel     Status: Abnormal   Collection Time: 04/08/22 12:52 AM  Result Value Ref Range  Sodium 138 135 - 145 mmol/L   Potassium 3.5 3.5 - 5.1 mmol/L   Chloride 106 98 - 111 mmol/L   CO2 23 22 - 32 mmol/L   Glucose, Bld 137 (H) 70 - 99 mg/dL   BUN 15 8 - 23 mg/dL   Creatinine, Ser 4.090.83 0.44 - 1.00 mg/dL   Calcium 9.5 8.9 - 81.110.3 mg/dL   Total Protein 7.2 6.5 - 8.1 g/dL   Albumin 4.0 3.5 - 5.0 g/dL   AST 72 (H) 15 - 41 U/L   ALT 41 0 -  44 U/L   Alkaline Phosphatase 48 38 - 126 U/L   Total Bilirubin 1.7 (H) 0.3 - 1.2 mg/dL   GFR, Estimated >91>60 >47>60 mL/min   Anion gap 9 5 - 15  CBC with Differential     Status: Abnormal   Collection Time: 04/08/22 12:52 AM  Result Value Ref Range   WBC 13.3 (H) 4.0 - 10.5 K/uL   RBC 4.91 3.87 - 5.11 MIL/uL   Hemoglobin 14.9 12.0 - 15.0 g/dL   HCT 82.942.5 56.236.0 - 13.046.0 %   MCV 86.6 80.0 - 100.0 fL   MCH 30.3 26.0 - 34.0 pg   MCHC 35.1 30.0 - 36.0 g/dL   RDW 86.513.0 78.411.5 - 69.615.5 %   Platelets 212 150 - 400 K/uL   nRBC 0.0 0.0 - 0.2 %   Neutrophils Relative % 84 %   Neutro Abs 11.1 (H) 1.7 - 7.7 K/uL   Lymphocytes Relative 11 %   Lymphs Abs 1.4 0.7 - 4.0 K/uL   Monocytes Relative 5 %   Monocytes Absolute 0.7 0.1 - 1.0 K/uL   Eosinophils Relative 0 %   Eosinophils Absolute 0.0 0.0 - 0.5 K/uL   Basophils Relative 0 %   Basophils Absolute 0.0 0.0 - 0.1 K/uL   Immature Granulocytes 0 %   Abs Immature Granulocytes 0.06 0.00 - 0.07 K/uL  Lipase, blood     Status: None   Collection Time: 04/08/22 12:52 AM  Result Value Ref Range   Lipase 39 11 - 51 U/L  Urinalysis, Routine w reflex microscopic -Urine, Clean Catch     Status: Abnormal   Collection Time: 04/08/22  2:38 AM  Result Value Ref Range   Color, Urine YELLOW YELLOW   APPearance HAZY (A) CLEAR   Specific Gravity, Urine 1.018 1.005 - 1.030   pH 5.0 5.0 - 8.0   Glucose, UA NEGATIVE NEGATIVE mg/dL   Hgb urine dipstick NEGATIVE NEGATIVE   Bilirubin Urine NEGATIVE NEGATIVE   Ketones, ur 5 (A) NEGATIVE mg/dL   Protein, ur NEGATIVE NEGATIVE mg/dL   Nitrite NEGATIVE NEGATIVE   Leukocytes,Ua NEGATIVE NEGATIVE  CBC with Differential/Platelet     Status: Abnormal   Collection Time: 04/08/22  8:46 AM  Result Value Ref Range   WBC 10.8 (H) 4.0 - 10.5 K/uL   RBC 4.92 3.87 - 5.11 MIL/uL   Hemoglobin 14.5 12.0 - 15.0 g/dL   HCT 29.543.2 28.436.0 - 13.246.0 %   MCV 87.8 80.0 - 100.0 fL   MCH 29.5 26.0 - 34.0 pg   MCHC 33.6 30.0 - 36.0 g/dL   RDW 44.013.1  10.211.5 - 72.515.5 %   Platelets 198 150 - 400 K/uL   nRBC 0.0 0.0 - 0.2 %   Neutrophils Relative % 83 %   Neutro Abs 8.9 (H) 1.7 - 7.7 K/uL   Lymphocytes Relative 8 %   Lymphs Abs 0.9 0.7 - 4.0 K/uL   Monocytes  Relative 8 %   Monocytes Absolute 0.9 0.1 - 1.0 K/uL   Eosinophils Relative 0 %   Eosinophils Absolute 0.0 0.0 - 0.5 K/uL   Basophils Relative 0 %   Basophils Absolute 0.0 0.0 - 0.1 K/uL   Immature Granulocytes 1 %   Abs Immature Granulocytes 0.05 0.00 - 0.07 K/uL  Comprehensive metabolic panel     Status: Abnormal   Collection Time: 04/08/22  8:46 AM  Result Value Ref Range   Sodium 139 135 - 145 mmol/L   Potassium 4.0 3.5 - 5.1 mmol/L   Chloride 104 98 - 111 mmol/L   CO2 25 22 - 32 mmol/L   Glucose, Bld 122 (H) 70 - 99 mg/dL   BUN 11 8 - 23 mg/dL   Creatinine, Ser 9.48 0.44 - 1.00 mg/dL   Calcium 9.6 8.9 - 54.6 mg/dL   Total Protein 7.1 6.5 - 8.1 g/dL   Albumin 3.8 3.5 - 5.0 g/dL   AST 270 (H) 15 - 41 U/L   ALT 187 (H) 0 - 44 U/L   Alkaline Phosphatase 56 38 - 126 U/L   Total Bilirubin 3.5 (H) 0.3 - 1.2 mg/dL   GFR, Estimated >35 >00 mL/min   Anion gap 10 5 - 15  Magnesium     Status: None   Collection Time: 04/08/22  8:46 AM  Result Value Ref Range   Magnesium 2.0 1.7 - 2.4 mg/dL  Bilirubin, direct     Status: Abnormal   Collection Time: 04/08/22  8:46 AM  Result Value Ref Range   Bilirubin, Direct 1.7 (H) 0.0 - 0.2 mg/dL     Imaging Orders         US Abdomen Limited RUQ (LIVER/GB)     1. Cholelithiasis, without evidence of acute cholecystitis. 2. Dilated common bile duct without visualized obstructing lesion. Further evaluation with MRCP is recommended. 3. 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 12.0 mm        DG Chest 2 View         MR ABDOMEN MRCP W WO CONTAST      Assessment and Plan   Angela Monroe is an 62 y.o. female with abdominal pain and imaging concerning for choledocholithiasis.  I feel she will need a laparoscopic cholecystectomy.   Prior to surgery I recommend GI evaluation for ERCP.  With a 12 mm common bile duct and her constellation of symptoms, I feel she would benefit from an ERCP prior to surgery.  She has multiple concerns about the in network/out of network costs of her care and may need to be transferred to an in-network hospital.     Quentin Ore, MD  [facility] Use [email or web address] to contact on call provider  New Patient Billing: [id number] - High MDM

## 2022-04-08 NOTE — ED Triage Notes (Signed)
BIB EMS/ pt c/o upper ABD pain starting Thursday night w/ nausea/ hx of gallstones/ EMS reports pos. Murphy sign/ pain radiates to upper back/ pt A&Ox4/ Vitals stable

## 2022-04-08 NOTE — Plan of Care (Signed)
0830:Rn took over care for pt, per report pt NPO for MRCP today 4/6. Pt resting bed at beginning of shift, pt alert snd oriented x4, pt on room air, pt independent and ambulatory, pt denies pain at this time.  Pt was seen by Dr. Tyson Babinski this morning, pt notified RN that pt told MD due to issues with her insurance not covering the MRCP she would not be having the test done. MD ordered pt for clear liquid diet.   RN reached out to MD to clarify plan of care, since MRI called RN for pt but pt declining test at this time. Per Dr.Pokhrel hold off on MRCP for now MD spoke with surgery, surgery to come by to see pt.   Around 11:15 Surgery came to see pt  1215: RN read surgery note and went to speak with pt about what surgery stated and what the plan was. Pt at first stated she would go for testing but per surgery note pt may be needing ercp, pt only ordered for MRCP at this time. Pt started to get upset with RN and asked RN if everything would be covered by pt's insurance or would pt get a bill/ pay out of pocket, RN explained to pt that RN does not handle those decisions but RN would reach out to MD and case manager. Pt stated, " you guys need to learn about ,lean model, so you can get stuff done faster, you are just waiting for me to die." RN educated pt that we are working together to care for her and RN would reach out to case manager.   RN sent a secure chat to Dr.Pokhrel and case manager to clarify that pt still ordered for MRCP but pt has been seen by surgery and  explain situation that pt upset about if her insurance would covering theses tests/procedures and the billing aspect of this.   At 1300 pt called RN to room and demanded RN to remove pt IV, pt stated she ws leaving AMA and did not want to continue to be here, pt stated she could find the information she wanted " at home in 30 seconds". RN made pt aware that case manager responded that " tests ordered while in the hospital are covered with the  hospital stay ". Pt stated that did not answer her questions and that it is "stupid". RN educated pt about the importance of staying and what can happen if pt leaves AMA, pt stated "I want to leave now, hurry up", RN notified Dr.Pokhrel pt wanting to leave AMA, RN removed pt IV per pt request, and pt signed AMA form took her belongings and walked off unit to the front main entrance.

## 2022-04-08 NOTE — ED Provider Notes (Signed)
EMERGENCY DEPARTMENT AT [facility] Provider Note   CSN: [id number] Arrival date & time: 04/08/22  0016     History  Chief Complaint  Patient presents with   Abdominal Pain    Angela Monroe is a 62 y.o. female.  The history is provided by the patient and medical records.  Abdominal Pain Angela Monroe is a 62 y.o. female who presents to the Emergency Department complaining of abdominal pain.  She presents to the emergency department for evaluation of a right upper quadrant abdominal pain that started around 8 PM.  She last ate at 5 PM.  She has associated nausea and 1 episode of emesis.  She did feel hot and cold.  No reported fevers.  She did have some diarrhea yesterday.  She had a milder pain earlier in the day after eating toast.  She has a history of hypertension, MS on Avonex.  No tobacco, alcohol, drugs.  No prior abdominal surgeries.     Home Medications Prior to Admission medications   Medication Sig Start Date End Date Taking? Authorizing Provider  AVONEX PREFILLED 30 MCG/0.5ML PSKT injection Inject into the muscle. 02/03/20   [provider]  Cholecalciferol 50 MCG (2000 UT) CAPS Take 4,000 Units by mouth.    [provider]  lisinopril-hydrochlorothiazide (ZESTORETIC) 20-12.5 MG tablet Take 0.5 tablets by mouth daily. 04/24/21   Loyola Mast, MD  Multiple Vitamin (MULTIVITAMIN) tablet Take 1 tablet by mouth daily.    [provider]      Allergies    Contrast media [iodinated contrast media] and Copaxone [glatiramer acetate]    Review of Systems   Review of Systems  Gastrointestinal:  Positive for abdominal pain.  All other systems reviewed and are negative.   Physical Exam Updated Vital Signs BP (!) 159/85 (BP Location: Right Arm)   Pulse 62   Temp 98.3 F (36.8 C) (Oral)   Resp 18   Wt 73.9 kg   LMP 10/13/2011   SpO2 99%   BMI 27.12 kg/m  Physical Exam Vitals and nursing note reviewed.   Constitutional:      Appearance: She is well-developed.  HENT:     Head: Normocephalic and atraumatic.  Cardiovascular:     Rate and Rhythm: Normal rate and regular rhythm.  Pulmonary:     Effort: Pulmonary effort is normal. No respiratory distress.  Abdominal:     Palpations: Abdomen is soft.     Tenderness: There is no guarding or rebound.     Comments: Moderate right upper quadrant tenderness  Musculoskeletal:        General: No swelling or tenderness.  Skin:    General: Skin is warm and dry.  Neurological:     Mental Status: She is alert and oriented to person, place, and time.  Psychiatric:        Behavior: Behavior normal.     ED Results / Procedures / Treatments   Labs (all labs ordered are listed, but only abnormal results are displayed) Labs Reviewed  COMPREHENSIVE METABOLIC PANEL - Abnormal; Notable for the following components:      Result Value   Glucose, Bld 137 (*)    AST 72 (*)    Total Bilirubin 1.7 (*)    All other components within normal limits  CBC WITH DIFFERENTIAL/PLATELET - Abnormal; Notable for the following components:   WBC 13.3 (*)    Neutro Abs 11.1 (*)    All other components within normal  limits  URINALYSIS, ROUTINE W REFLEX MICROSCOPIC - Abnormal; Notable for the following components:   APPearance HAZY (*)    Ketones, ur 5 (*)    All other components within normal limits  LIPASE, BLOOD  CBC WITH DIFFERENTIAL/PLATELET  COMPREHENSIVE METABOLIC PANEL  MAGNESIUM  BILIRUBIN, DIRECT    EKG EKG Interpretation  Date/Time:  Saturday April 08 2022 00:42:10 EDT Ventricular Rate:  60 PR Interval:  150 QRS Duration: 68 QT Interval:  394 QTC Calculation: 394 R Axis:   76 Text Interpretation: Normal sinus rhythm Normal ECG No previous ECGs available Confirmed by Tilden Fossaees, Shawndra Clute [phone]([id number]) on 04/08/2022 1:51:09 AM  Radiology US Abdomen Limited RUQ (LIVER/GB)  Result Date: 04/08/2022 CLINICAL DATA:  Right upper quadrant pain. EXAM: ULTRASOUND  ABDOMEN LIMITED RIGHT UPPER QUADRANT COMPARISON:  None Available. FINDINGS: Gallbladder: A 1.5 cm shadowing echogenic gallstone is seen within the dependent portion of the gallbladder lumen. The gallbladder wall measures 2.9 mm in thickness. No sonographic Murphy sign noted by sonographer. Common bile duct: Diameter: 12.0 mm Liver: No focal lesion identified. Diffusely increased echogenicity of the liver parenchyma is noted. Portal vein is patent on color Doppler imaging with normal direction of blood flow towards the liver. Other: None. IMPRESSION: 1. Cholelithiasis, without evidence of acute cholecystitis. 2. Dilated common bile duct without visualized obstructing lesion. Further evaluation with MRCP is recommended. 3. 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 Electronically Signed   By: Aram Candelahaddeus  Houston M.D.   On: 04/08/2022 02:23   DG Chest 2 View  Result Date: 04/08/2022 CLINICAL DATA:  Chest tightness, upper abdominal pain EXAM: CHEST - 2 VIEW COMPARISON:  None Available. FINDINGS: Cardiac contours at the upper limit of normal. No focal pulmonary opacity. No pleural effusion or pneumothorax. No acute osseous abnormality. IMPRESSION: No acute cardiopulmonary process. Electronically Signed   By: Wiliam KeAlison  Vasan M.D.   On: 04/08/2022 01:16    Procedures Procedures    Medications Ordered in ED Medications  acetaminophen (TYLENOL) tablet 650 mg (has no administration in time range)    Or  acetaminophen (TYLENOL) suppository 650 mg (has no administration in time range)  melatonin tablet 3 mg (has no administration in time range)  ondansetron (ZOFRAN) injection 4 mg (has no administration in time range)  naloxone [facility](NARCAN) injection 0.4 mg (has no administration in time range)  fentaNYL (SUBLIMAZE) injection 25 mcg (has no administration in time range)  sodium chloride 0.9 % bolus 500 mL (0 mLs Intravenous Stopped 04/08/22 0239)  morphine (PF) 4 MG/ML injection 4 mg (4 mg Intravenous Given 04/08/22  0115)  ondansetron (ZOFRAN) injection 4 mg (4 mg Intravenous Given 04/08/22 0115)  cefTRIAXone (ROCEPHIN) 2 g in sodium chloride 0.9 % 100 mL IVPB (0 g Intravenous Stopped 04/08/22 0426)    ED Course/ Medical Decision Making/ A&P                             Medical Decision Making Amount and/or Complexity of Data Reviewed Labs: ordered. Radiology: ordered.  Risk Prescription drug management. Decision regarding hospitalization.   Pt with hx/o MS here for evaluation of RUQ abdominal pain and vomiting.  She has focal RUQ tenderness, mild leukocytosis and mild elevation in her AST and bilirubin.  Right upper quadrant ultrasound is significant for cholelithiasis and mild common bile duct dilation.  On repeat evaluation her pain is significantly improved after pain medications but she does have some mild focal right upper quadrant tenderness.  Given  these findings recommend admission for MRCP and further evaluation.  She was treated with empirically with Rocephin for possible early cholecystitis.  Hospitalist consulted for admission for ongoing workup.  Patient updated of findings of studies and recommendation for admission and she is in agreement with treatment plan.        Final Clinical Impression(s) / ED Diagnoses Final diagnoses:  None    Rx / DC Orders ED Discharge Orders     None         Tilden Fossa, MD 04/08/22 [phone]

## 2022-04-08 NOTE — Progress Notes (Signed)
Patient arrived to the room with her purse and ambulated to the bed. Patient is alert and oriented x4, denies pain. VS stable. Patient was oriented to the room, staff, and call bell. Patient was educated on the indication for SCDs, patient verbalized understanding of education and refused SCDs.  Angela Monroe

## 2022-04-08 NOTE — ED Notes (Signed)
ED TO INPATIENT HANDOFF REPORT  ED Nurse Name and Phone #: Karl Knarr  S Name/Age/Gender Angela Monroe 62 y.o. female Room/Bed: H021C/H021C  Code Status   Code Status: Prior  Home/SNF/Other Home Patient oriented to: self, place, time, and situation Is this baseline? No   Triage Complete: Triage complete  Chief Complaint Symptomatic cholelithiasis [K80.20]  Triage Note Pt states she left AMA this morning, because she wasn't sure if the test would be covered and she needed to go home and get her paperwork to find out if the test was covered.    Allergies Allergies  Allergen Reactions   Contrast Media [Iodinated Contrast Media]     Per pt-- "dry mouth, affect and my salivary glands, I don't like it again"   Copaxone [Glatiramer Acetate] Hives    Specific to this formulation. Now on Glatopa without hives.    Level of Care/Admitting Diagnosis ED Disposition     ED Disposition  Admit   Condition  --   Comment  Hospital Area: [facility] [[id number]]  Level of Care: Med-Surg [16]  May admit patient to [name] or [facility] if equivalent level of care is available:: No  Covid Evaluation: Asymptomatic - no recent exposure (last 10 days) testing not required  Diagnosis: Symptomatic cholelithiasis [161096]  Admitting Physician: Anselm Jungling [[id number]]  Attending Physician: Anselm Jungling [[id number]]  Certification:: I certify this patient will need inpatient services for at least 2 midnights  Estimated Length of Stay: 3          B Medical/Surgery History Past Medical History:  Diagnosis Date   MS (multiple sclerosis)    Past Surgical History:  Procedure Laterality Date   CARPAL TUNNEL RELEASE Right    TONSILLECTOMY       A IV Location/Drains/Wounds Patient Lines/Drains/Airways Status     Active Line/Drains/Airways     Name Placement date Placement time Site Days   Peripheral IV 04/08/22 20 G Left Antecubital 04/08/22  0000  Antecubital   less than 1            Intake/Output Last 24 hours No intake or output data in the 24 hours ending 04/08/22 1926  Labs/Imaging Results for orders placed or performed during the hospital encounter of 04/08/22 (from the past 48 hour(s))  Comprehensive metabolic panel     Status: Abnormal   Collection Time: 04/08/22 12:52 AM  Result Value Ref Range   Sodium 138 135 - 145 mmol/L   Potassium 3.5 3.5 - 5.1 mmol/L   Chloride 106 98 - 111 mmol/L   CO2 23 22 - 32 mmol/L   Glucose, Bld 137 (H) 70 - 99 mg/dL    Comment: Glucose reference range applies only to samples taken after fasting for at least 8 hours.   BUN 15 8 - 23 mg/dL   Creatinine, Ser 2.95 0.44 - 1.00 mg/dL   Calcium 9.5 8.9 - 62.1 mg/dL   Total Protein 7.2 6.5 - 8.1 g/dL   Albumin 4.0 3.5 - 5.0 g/dL   AST 72 (H) 15 - 41 U/L   ALT 41 0 - 44 U/L   Alkaline Phosphatase 48 38 - 126 U/L   Total Bilirubin 1.7 (H) 0.3 - 1.2 mg/dL   GFR, Estimated >30 >86 mL/min    Comment: (NOTE) Calculated using the CKD-EPI Creatinine Equation (2021)    Anion gap 9 5 - 15    Comment: Performed at [facility], [address] [place],  [place] [address]  CBC with Differential     Status: Abnormal   Collection Time: 04/08/22 12:52 AM  Result Value Ref Range   WBC 13.3 (H) 4.0 - 10.5 K/uL   RBC 4.91 3.87 - 5.11 MIL/uL   Hemoglobin 14.9 12.0 - 15.0 g/dL   HCT 60.4 54.0 - 98.1 %   MCV 86.6 80.0 - 100.0 fL   MCH 30.3 26.0 - 34.0 pg   MCHC 35.1 30.0 - 36.0 g/dL   RDW 19.1 47.8 - 29.5 %   Platelets 212 150 - 400 K/uL   nRBC 0.0 0.0 - 0.2 %   Neutrophils Relative % 84 %   Neutro Abs 11.1 (H) 1.7 - 7.7 K/uL   Lymphocytes Relative 11 %   Lymphs Abs 1.4 0.7 - 4.0 K/uL   Monocytes Relative 5 %   Monocytes Absolute 0.7 0.1 - 1.0 K/uL   Eosinophils Relative 0 %   Eosinophils Absolute 0.0 0.0 - 0.5 K/uL   Basophils Relative 0 %   Basophils Absolute 0.0 0.0 - 0.1 K/uL   Immature Granulocytes 0 %   Abs Immature Granulocytes 0.06 0.00 -  0.07 K/uL    Comment: Performed at [facility], [address] [place], [place] [address]  Lipase, blood     Status: None   Collection Time: 04/08/22 12:52 AM  Result Value Ref Range   Lipase 39 11 - 51 U/L    Comment: Performed at [facility], [address] [place], [place] [address]  Urinalysis, Routine w reflex microscopic -Urine, Clean Catch     Status: Abnormal   Collection Time: 04/08/22  2:38 AM  Result Value Ref Range   Color, Urine YELLOW YELLOW   APPearance HAZY (A) CLEAR   Specific Gravity, Urine 1.018 1.005 - 1.030   pH 5.0 5.0 - 8.0   Glucose, UA NEGATIVE NEGATIVE mg/dL   Hgb urine dipstick NEGATIVE NEGATIVE   Bilirubin Urine NEGATIVE NEGATIVE   Ketones, ur 5 (A) NEGATIVE mg/dL   Protein, ur NEGATIVE NEGATIVE mg/dL   Nitrite NEGATIVE NEGATIVE   Leukocytes,Ua NEGATIVE NEGATIVE    Comment: Performed at [facility], [address], [place], [place] [address]  CBC with Differential/Platelet     Status: Abnormal   Collection Time: 04/08/22  8:46 AM  Result Value Ref Range   WBC 10.8 (H) 4.0 - 10.5 K/uL   RBC 4.92 3.87 - 5.11 MIL/uL   Hemoglobin 14.5 12.0 - 15.0 g/dL   HCT 95.2 84.1 - 32.4 %   MCV 87.8 80.0 - 100.0 fL   MCH 29.5 26.0 - 34.0 pg   MCHC 33.6 30.0 - 36.0 g/dL   RDW 40.1 02.7 - 25.3 %   Platelets 198 150 - 400 K/uL   nRBC 0.0 0.0 - 0.2 %   Neutrophils Relative % 83 %   Neutro Abs 8.9 (H) 1.7 - 7.7 K/uL   Lymphocytes Relative 8 %   Lymphs Abs 0.9 0.7 - 4.0 K/uL   Monocytes Relative 8 %   Monocytes Absolute 0.9 0.1 - 1.0 K/uL   Eosinophils Relative 0 %   Eosinophils Absolute 0.0 0.0 - 0.5 K/uL   Basophils Relative 0 %   Basophils Absolute 0.0 0.0 - 0.1 K/uL   Immature Granulocytes 1 %   Abs Immature Granulocytes 0.05 0.00 - 0.07 K/uL    Comment: Performed at [facility], [address], [place], [place] [address]  Comprehensive metabolic panel  Status: Abnormal   Collection Time: 04/08/22  8:46 AM  Result Value Ref  Range   Sodium 139 135 - 145 mmol/L   Potassium 4.0 3.5 - 5.1 mmol/L   Chloride 104 98 - 111 mmol/L   CO2 25 22 - 32 mmol/L   Glucose, Bld 122 (H) 70 - 99 mg/dL    Comment: Glucose reference range applies only to samples taken after fasting for at least 8 hours.   BUN 11 8 - 23 mg/dL   Creatinine, Ser 1.320.94 0.44 - 1.00 mg/dL   Calcium 9.6 8.9 - 44.010.3 mg/dL   Total Protein 7.1 6.5 - 8.1 g/dL   Albumin 3.8 3.5 - 5.0 g/dL   AST 102280 (H) 15 - 41 U/L   ALT 187 (H) 0 - 44 U/L   Alkaline Phosphatase 56 38 - 126 U/L   Total Bilirubin 3.5 (H) 0.3 - 1.2 mg/dL   GFR, Estimated >72>60 >53>60 mL/min    Comment: (NOTE) Calculated using the CKD-EPI Creatinine Equation (2021)    Anion gap 10 5 - 15    Comment: Performed at [facility], [address] [place], [place] [address]  Magnesium     Status: None   Collection Time: 04/08/22  8:46 AM  Result Value Ref Range   Magnesium 2.0 1.7 - 2.4 mg/dL    Comment: Performed at [facility], [address] [place], [place] [address]  Bilirubin, direct     Status: Abnormal   Collection Time: 04/08/22  8:46 AM  Result Value Ref Range   Bilirubin, Direct 1.7 (H) 0.0 - 0.2 mg/dL    Comment: Performed at [facility], [address] [place], [place] [address]   US Abdomen Limited RUQ (LIVER/GB)  Result Date: 04/08/2022 CLINICAL DATA:  Right upper quadrant pain. EXAM: ULTRASOUND ABDOMEN LIMITED RIGHT UPPER QUADRANT COMPARISON:  None Available. FINDINGS: Gallbladder: A 1.5 cm shadowing echogenic gallstone is seen within the dependent portion of the gallbladder lumen. The gallbladder wall measures 2.9 mm in thickness. No sonographic Murphy sign noted by sonographer. Common bile duct: Diameter: 12.0 mm Liver: No focal lesion identified. Diffusely increased echogenicity of the liver parenchyma is noted. Portal vein is patent on color Doppler imaging with normal direction of blood flow towards the liver. Other: None. IMPRESSION: 1. Cholelithiasis,  without evidence of acute cholecystitis. 2. Dilated common bile duct without visualized obstructing lesion. Further evaluation with MRCP is recommended. 3. 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 Electronically Signed   By: Aram Candelahaddeus  Houston M.D.   On: 04/08/2022 02:23   DG Chest 2 View  Result Date: 04/08/2022 CLINICAL DATA:  Chest tightness, upper abdominal pain EXAM: CHEST - 2 VIEW COMPARISON:  None Available. FINDINGS: Cardiac contours at the upper limit of normal. No focal pulmonary opacity. No pleural effusion or pneumothorax. No acute osseous abnormality. IMPRESSION: No acute cardiopulmonary process. Electronically Signed   By: Wiliam KeAlison  Vasan M.D.   On: 04/08/2022 01:16    Pending Labs Unresulted Labs (From admission, onward)    None       Vitals/Pain Today's Vitals   04/08/22 1639 04/08/22 1640 04/08/22 1701  BP:  (!) 142/92 134/81  Pulse:  87 74  Resp:  16 16  Temp:  99 F (37.2 C) 98.4 F (36.9 C)  TempSrc:  Oral Oral  SpO2:  94% 94%  Weight:  73.9 kg   Height:  5\' 5"  (1.651 m)   PainSc: 0-No pain  Isolation Precautions No active isolations  Medications Medications  sodium chloride 0.9 % bolus 1,000 mL (has no administration in time range)    Mobility walks     Focused Assessments     R Recommendations: See Admitting Provider Note  Report given to:   Additional Notes:

## 2022-04-08 NOTE — ED Provider Notes (Signed)
[place] EMERGENCY DEPARTMENT AT [facility] Provider Note   CSN: [id number] Arrival date & time: 04/08/22  7035     History Chief Complaint  Patient presents with   test    Angela Monroe is a 62 y.o. female.  Patient presents to the emergency department with complaints of abdominal pain.  Patient was evaluated in the emergency department earlier today for similar concerns and at that time she was diagnosed with cholelithiasis with some common bile duct dilation noted on ultrasound.  Patient was admitted with plan for general surgery to likely perform laparoscopic cholecystectomy.  Patient was being managed by internal medicine but she left AMA after being concern about possible cost that she received a bill for services stating that this was out of network.  Patient reports she went home to verify that this was indeed and that work and states that she is now confident that the services are in network and would like to proceed with treatment.  HPI     Home Medications Prior to Admission medications   Medication Sig Start Date End Date Taking? Authorizing Provider  AVONEX PREFILLED 30 MCG/0.5ML PSKT injection Inject 30 mcg into the muscle once a week. 02/03/20   [provider]  Cholecalciferol 50 MCG (2000 UT) CAPS Take 4,000 Units by mouth daily.    [provider]  lisinopril-hydrochlorothiazide (ZESTORETIC) 20-12.5 MG tablet Take 0.5 tablets by mouth daily. 04/24/21   Loyola Mast, MD  Multiple Vitamin (MULTIVITAMIN) tablet Take 1 tablet by mouth daily.    [provider]      Allergies    Contrast media [iodinated contrast media] and Copaxone [glatiramer acetate]    Review of Systems   Review of Systems  Physical Exam Updated Vital Signs BP (!) 136/94 (BP Location: Right Arm)   Pulse 64   Temp 98.6 F (37 C) (Oral)   Resp 16   Ht 5\' 5"  (1.651 m)   Wt 73.9 kg   LMP 10/13/2011   SpO2 98%   BMI 27.11 kg/m  Physical Exam  ED  Results / Procedures / Treatments   Labs (all labs ordered are listed, but only abnormal results are displayed) Labs Reviewed  COMPREHENSIVE METABOLIC PANEL  CBC    EKG None  Radiology US Abdomen Limited RUQ (LIVER/GB)  Result Date: 04/08/2022 CLINICAL DATA:  Right upper quadrant pain. EXAM: ULTRASOUND ABDOMEN LIMITED RIGHT UPPER QUADRANT COMPARISON:  None Available. FINDINGS: Gallbladder: A 1.5 cm shadowing echogenic gallstone is seen within the dependent portion of the gallbladder lumen. The gallbladder wall measures 2.9 mm in thickness. No sonographic Murphy sign noted by sonographer. Common bile duct: Diameter: 12.0 mm Liver: No focal lesion identified. Diffusely increased echogenicity of the liver parenchyma is noted. Portal vein is patent on color Doppler imaging with normal direction of blood flow towards the liver. Other: None. IMPRESSION: 1. Cholelithiasis, without evidence of acute cholecystitis. 2. Dilated common bile duct without visualized obstructing lesion. Further evaluation with MRCP is recommended. 3. 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 Electronically Signed   By: Aram Candela M.D.   On: 04/08/2022 02:23   DG Chest 2 View  Result Date: 04/08/2022 CLINICAL DATA:  Chest tightness, upper abdominal pain EXAM: CHEST - 2 VIEW COMPARISON:  None Available. FINDINGS: Cardiac contours at the upper limit of normal. No focal pulmonary opacity. No pleural effusion or pneumothorax. No acute osseous abnormality. IMPRESSION: No acute cardiopulmonary process. Electronically Signed   By: Elaina Pattee.D.  On: 04/08/2022 01:16    Procedures Procedures   Medications Ordered in ED Medications  acetaminophen (TYLENOL) tablet 650 mg (has no administration in time range)  morphine (PF) 2 MG/ML injection 2 mg (has no administration in time range)  lisinopril (ZESTRIL) tablet 10 mg (has no administration in time range)    And  hydrochlorothiazide (HYDRODIURIL) tablet 6.25 mg (has  no administration in time range)  sodium chloride 0.9 % bolus 1,000 mL ( Intravenous Stopped 04/08/22 2221)    ED Course/ Medical Decision Making/ A&P Clinical Course as of 04/08/22 2305  Sat Apr 08, 2022  1847 Comprehensive metabolic panel (04/08/22 0214)(!) [OZ]  1847 CBC with Differential (04/08/22 0140)(!) [OZ]  1847 Lipase, blood (04/08/22 0214) [OZ]  1847 Urinalysis, Routine w reflex microscopic -Urine, Clean Catch (04/08/22 0253)(!) [OZ]    Clinical Course User Index [OZ] Smitty Knudsen, PA-C                           Medical Decision Making Amount and/or Complexity of Data Reviewed Labs:  Decision-making details documented in ED Course.  Risk Decision regarding hospitalization.   This patient presents to the ED for concern of right quadrant abdominal pain. Differential diagnosis includes cholecystitis, choledocholithiasis, ascending cholangitis, pancreatitis, hepatitis   Lab Tests:  I Ordered, and personally interpreted labs.  The pertinent results include: Labs performed earlier today which showed a notable elevation in bilirubin at 1.7, leukocytosis white count of 13.3, lipase normal, urinalysis without evidence of UTI.   Imaging Studies ordered:  I ordered imaging studies including ultrasound right upper quadrant of abdomen, chest x-ray performed earlier I independently visualized and interpreted imaging which showed cholelithiasis, dilated common bile duct I agree with the radiologist interpretation   Problem List / ED Course:  Patient presented to the ED for RUQ abdominal pain. She was seen earlier today for similar concerns and admitted to the hospital. Spoke with Dr. Cyndia Bent who agrees to admit patient given that condition has not changed and patient is now comfortable being treated here after confirming insurance is covered here. Also spoke with Dr. Janee Morn with general surgery to inform him about patient status and previous plan made by Dr. Dossie Der for likely  laparoscopic cholecystectomy. Dr. Janee Morn agreeable to admit for likely lap chole and advised to keep patient NPO but can administer IV fluids. He advised to hold off on labs until the morning as general surgery will evaluate patient then for potential ERCP prior to lap chole depending on bilirubin level.  Final Clinical Impression(s) / ED Diagnoses Final diagnoses:  Calculus of gallbladder and bile duct without cholecystitis or obstruction    Rx / DC Orders ED Discharge Orders     None         Smitty Knudsen, PA-C 04/08/22 2305    Linwood Dibbles, MD 04/09/22 1002

## 2022-04-08 NOTE — Hospital Course (Signed)
Patient is a 62 years old female with past medical history of multiple sclerosis on Avonex, hypertension presented to hospital with abdominal pain for 3 days prior to presentation with nausea and abdominal pain which was acute in onset after her dinner.  Patient does have history of gallstones.  She also had 1 episode of vomiting.  In the ED, initial labs showed total bilirubin elevated at 1.7 with AST of 72 WBC was elevated at 13.3.  Urinalysis showed some ketones.  EKG showed normal sinus rhythm.  Ultrasound of the right upper quadrant showed cholelithiasis without cholecystitis but dilated common bile duct.  Chest x-ray was negative for acute findings.  In the ED patient received Tylenol, and Zofran, fentanyl, morphine and and was admitted hospital for further evaluation and treatment.  Assessment and plan.  Right upper quadrant pain nausea vomiting.  History of gallstones.  Ultrasound showing cholelithiasis without cholecystitis.  Mild elevation of LFTs on presentation.  Has slightly trended down.  CBD dilation noted on the ultrasound.  Will get MRCP.  Currently n.p.o. IV fluids analgesics and symptomatic care.  History of hypertension Patient is on lisinopril hydrochlorothiazide as outpatient.  Currently on hold.  Will put the patient on as needed hydralazine.  History of multiple sclerosis on Avenox

## 2022-04-08 NOTE — Assessment & Plan Note (Addendum)
-  Right upper quadrant ultrasound showed cholelithiasis without signs of cholecystitis. CBD dilatation to to 62mm noted on ultrasound. Elevated LFT and total bilirubin up to 3.5 on presentation.  -No fever with improving leukocytosis so low concern for ascending cholangitis. Will not initiate antibiotics. -She was evaluated by General surgery Dr. Dossie Der earlier who recommended that she receive a GI evaluation for ERCP prior to proceeding with laparoscopic cholecystectomy.  -will obtain MRCP.  Message sent to unassigned [facility] Dr. Leonides Schanz to consult in the morning -follow CMP -keep on full liquid diet and then NPO past midnight

## 2022-04-08 NOTE — Progress Notes (Signed)
  Carryover admission to the Day Admitter.  I discussed this case with the EDP, Dr. Madilyn Hook.  Per these discussions:   This is a  62 y.o. female who is being admitted for further evaluation and management of presenting upper quadrant abdominal discomfort, which started around 8 PM on 04/07/2022 after finishing dinner around 5 PM.  This been associate with some nausea resulting in 1-2 episodes of nonbloody, nonbilious emesis.  Not associate with any fever.  Afebrile.   Presenting labs notable for mildly elevated white blood cell count of 13,300.  CMP notable for alkaline phosphatase 48, AST 72, ALT 41, and total bilirubin 1.7.  Right upper quadrant ultrasound shows cholelithiasis without evidence of acute cholecystitis, also demonstrating mildly dilated common bile duct, without overt evidence of choledocholithiasis.  She has received a dose of Rocephin.  In the absence of fever or jaundice, presentation does not clinically appear consistent with descending cholangitis at this time.  I have placed an order for inpatient admission for further evaluation management of the above.  I have placed some additional preliminary admit orders via the adult multi-morbid admission order set. I have also ordered repeat CMP in the morning along with direct bilirubin level.  Also ordered CBC and serum magnesium level, prn Zofran, as needed IV fentanyl, and MRCP.  Of note, case has not been discussed with general surgery at this time.  I will defer additional decision making regarding need for additional antibiotics to the admitting hospitalist.    Newton Pigg, DO Hospitalist

## 2022-04-08 NOTE — Assessment & Plan Note (Signed)
-  controlled -continue home Lisinopril-HCTZ

## 2022-04-09 ENCOUNTER — Inpatient Hospital Stay (HOSPITAL_COMMUNITY): Payer: No Typology Code available for payment source

## 2022-04-09 DIAGNOSIS — G35 Multiple sclerosis: Secondary | ICD-10-CM | POA: Diagnosis not present

## 2022-04-09 DIAGNOSIS — K760 Fatty (change of) liver, not elsewhere classified: Secondary | ICD-10-CM

## 2022-04-09 DIAGNOSIS — K831 Obstruction of bile duct: Secondary | ICD-10-CM | POA: Diagnosis present

## 2022-04-09 DIAGNOSIS — Z8669 Personal history of other diseases of the nervous system and sense organs: Secondary | ICD-10-CM | POA: Diagnosis not present

## 2022-04-09 DIAGNOSIS — R945 Abnormal results of liver function studies: Secondary | ICD-10-CM

## 2022-04-09 DIAGNOSIS — K8050 Calculus of bile duct without cholangitis or cholecystitis without obstruction: Secondary | ICD-10-CM | POA: Diagnosis not present

## 2022-04-09 DIAGNOSIS — I10 Essential (primary) hypertension: Secondary | ICD-10-CM | POA: Diagnosis not present

## 2022-04-09 DIAGNOSIS — R1011 Right upper quadrant pain: Secondary | ICD-10-CM

## 2022-04-09 LAB — COMPREHENSIVE METABOLIC PANEL
ALT: 270 U/L — ABNORMAL HIGH (ref 0–44)
AST: 221 U/L — ABNORMAL HIGH (ref 15–41)
Albumin: 3.4 g/dL — ABNORMAL LOW (ref 3.5–5.0)
Alkaline Phosphatase: 57 U/L (ref 38–126)
Anion gap: 8 (ref 5–15)
BUN: 8 mg/dL (ref 8–23)
CO2: 24 mmol/L (ref 22–32)
Calcium: 9 mg/dL (ref 8.9–10.3)
Chloride: 108 mmol/L (ref 98–111)
Creatinine, Ser: 1.01 mg/dL — ABNORMAL HIGH (ref 0.44–1.00)
GFR, Estimated: >60 mL/min (ref >60)
Glucose, Bld: 101 mg/dL — ABNORMAL HIGH (ref 70–99)
Potassium: 3.9 mmol/L (ref 3.5–5.1)
Sodium: 140 mmol/L (ref 135–145)
Total Bilirubin: 5.4 mg/dL — ABNORMAL HIGH (ref 0.3–1.2)
Total Protein: 6.7 g/dL (ref 6.5–8.1)

## 2022-04-09 LAB — CBC
HCT: 40.1 % (ref 36.0–46.0)
Hemoglobin: 14 g/dL (ref 12.0–15.0)
MCH: 30.6 pg (ref 26.0–34.0)
MCHC: 34.9 g/dL (ref 30.0–36.0)
MCV: 87.7 fL (ref 80.0–100.0)
Platelets: 182 10*3/uL (ref 150–400)
RBC: 4.57 MIL/uL (ref 3.87–5.11)
RDW: 13.2 % (ref 11.5–15.5)
WBC: 6.1 10*3/uL (ref 4.0–10.5)
nRBC: 0 % (ref 0.0–0.2)

## 2022-04-09 MED ORDER — INDOMETHACIN 50 MG RE SUPP
100.0000 mg | Freq: Once | RECTAL | Status: DC
  Filled 2022-04-09: qty 2

## 2022-04-09 MED ORDER — INDOMETHACIN 50 MG RE SUPP: 100.0000 mg | Freq: Once | RECTAL | Status: DC

## 2022-04-09 MED ORDER — SODIUM CHLORIDE 0.9 % IV SOLN: INTRAVENOUS | Status: DC

## 2022-04-09 NOTE — Progress Notes (Signed)
PROGRESS NOTE    Angela Monroe  HWE:[id number] DOB: 13-Jan-1960 DOA: 04/08/2022 PCP: Quita Skye, PA-C    Brief Narrative:  Angela Monroe is a 62 y.o. female with medical history significant of  multiple sclerosis on Avonex, hypertension presented to hospital with abdominal pain for 4 days prior to presentation with nausea and 1 episode of vomiting.  Abdominal pain which was acute in onset after her dinner.  In the ED, initial labs on initial presentation on 04/08/2022 showed total bilirubin elevated at 1.7 with AST of 72 WBC was elevated at 13.3.  Urinalysis showed some ketones.  EKG showed normal sinus rhythm.  Ultrasound of the right upper quadrant showed cholelithiasis without cholecystitis but dilated common bile duct.  Chest x-ray was negative for acute findings.  Patient was then considered for admission to hospital but then decided to leave AGAINST MEDICAL ADVICE but subsequently returned to the hospital for further evaluation and treatment.  Assessment and plan.   Right upper quadrant pain nausea vomiting.  History of gallstones elevated LFT.  Ultrasound showing cholelithiasis without cholecystitis.  Mild elevation of LFTs on presentation.   CBD dilation noted on the ultrasound.  MRCP pending.  [facility]  for possible ERCP on 04/10/2022.Marland Kitchen  General surgery on board as well.    Currently  IV fluids analgesics and symptomatic care.  Bilirubin today at 5.4 and trending up with elevated AST ALT.  Complains of postprandial pain otherwise no pain at this time.   History of hypertension On lisinopril hydrochlorothiazide    History of multiple sclerosis on Avenox      DVT prophylaxis: SCDs Start: 04/08/22 1937   Code Status:     Code Status: Full Code  Disposition: Home likely in 2 to 3 days Status is: Inpatient Remains inpatient appropriate because: CBD dilatation, cholelithiasis, need for MRCP ERCP and possible surgery   Family Communication: None at bedside  Consultants:   General surgery GI  Procedures:  None yet  Antimicrobials:  None  Anti-infectives (From admission, onward)    None        Subjective: Today, patient was seen and examined at bedside.  No pain nausea vomiting at this time but stated that she has severe pain after eating something.  Objective: Vitals:   04/08/22 2004 04/08/22 2033 04/09/22 0533 04/09/22 0737  BP: 135/80 (!) 136/94 130/70 114/63  Pulse: 68 64 64 (!) 58  Resp: 16 16 16 16   Temp: 98.4 F (36.9 C) 98.6 F (37 C) 98.4 F (36.9 C) 98.3 F (36.8 C)  TempSrc:  Oral Oral Oral  SpO2: 96% 98% 97% 95%  Weight:      Height:        Intake/Output Summary (Last 24 hours) at 04/09/2022 0919 Last data filed at 04/08/2022 2222 Gross per 24 hour  Intake 1120.75 ml  Output --  Net 1120.75 ml   Filed Weights   04/08/22 1640  Weight: 73.9 kg    Physical Examination: Body mass index is 27.11 kg/m.  General:  Average built, not in obvious distress HENT:   Mild icterus.. Oral mucosa is moist.  Chest:  Clear breath sounds.  Diminished breath sounds bilaterally. No crackles or wheezes.  CVS: S1 &S2 heard. No murmur.  Regular rate and rhythm. Abdomen: Soft, nontender, nondistended.  Bowel sounds are heard.   Extremities: No cyanosis, clubbing or edema.  Peripheral pulses are palpable. Psych: Alert, awake and oriented, normal mood CNS:  No cranial nerve deficits.  Power equal in  all extremities.   Skin: Warm and dry.  No rashes noted.  Data Reviewed:   CBC: Recent Labs  Lab 04/08/22 0052 04/08/22 0846 04/09/22 0433  WBC 13.3* 10.8* 6.1  NEUTROABS 11.1* 8.9*  --   HGB 14.9 14.5 14.0  HCT 42.5 43.2 40.1  MCV 86.6 87.8 87.7  PLT 212 198 182    Basic Metabolic Panel: Recent Labs  Lab 04/08/22 0052 04/08/22 0846 04/09/22 0433  NA 138 139 140  K 3.5 4.0 3.9  CL 106 104 108  CO2 23 25 24   GLUCOSE 137* 122* 101*  BUN 15 11 8   CREATININE 0.83 0.94 1.01*  CALCIUM 9.5 9.6 9.0  MG  --  2.0  --      Liver Function Tests: Recent Labs  Lab 04/08/22 0052 04/08/22 0846 04/09/22 0433  AST 72* 280* 221*  ALT 41 187* 270*  ALKPHOS 48 56 57  BILITOT 1.7* 3.5* 5.4*  PROT 7.2 7.1 6.7  ALBUMIN 4.0 3.8 3.4*     Radiology Studies: US Abdomen Limited RUQ (LIVER/GB)  Result Date: 04/08/2022 CLINICAL DATA:  Right upper quadrant pain. EXAM: ULTRASOUND ABDOMEN LIMITED RIGHT UPPER QUADRANT COMPARISON:  None Available. FINDINGS: Gallbladder: A 1.5 cm shadowing echogenic gallstone is seen within the dependent portion of the gallbladder lumen. The gallbladder wall measures 2.9 mm in thickness. No sonographic Murphy sign noted by sonographer. Common bile duct: Diameter: 12.0 mm Liver: No focal lesion identified. Diffusely increased echogenicity of the liver parenchyma is noted. Portal vein is patent on color Doppler imaging with normal direction of blood flow towards the liver. Other: None. IMPRESSION: 1. Cholelithiasis, without evidence of acute cholecystitis. 2. Dilated common bile duct without visualized obstructing lesion. Further evaluation with MRCP is recommended. 3. 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 Electronically Signed   By: Aram Candelahaddeus  Houston M.D.   On: 04/08/2022 02:23   DG Chest 2 View  Result Date: 04/08/2022 CLINICAL DATA:  Chest tightness, upper abdominal pain EXAM: CHEST - 2 VIEW COMPARISON:  None Available. FINDINGS: Cardiac contours at the upper limit of normal. No focal pulmonary opacity. No pleural effusion or pneumothorax. No acute osseous abnormality. IMPRESSION: No acute cardiopulmonary process. Electronically Signed   By: Wiliam KeAlison  Vasan M.D.   On: 04/08/2022 01:16      LOS: 1 day    Joycelyn DasLaxman Kaydyn Sayas, MD [facility] Available via Epic secure chat 7am-7pm After these hours, please refer to coverage provider listed on [email or web address] 04/09/2022, 9:19 AM

## 2022-04-09 NOTE — H&P (View-Only) (Signed)
 Referring Provider: Dr. Laxman Pokhrel Primary Care Physician:  Darrow, Morgan, PA-C Primary Gastroenterologist: Unassigned  Reason for Consultation: RUQ pain, elevated LFTs, suspected choledocholithiasis  HPI: Angela Monroe is a 62 y.o. 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 Past surgical history includes past tonsillectomy and right carpal tunnel release. She presented to the ED with RUQ abdominal pain x 3 days. Labs in the ED showed a WBC count of 13.3 -> 10.8. Hemoglobin 14.9.  Platelet 212.  BUN 15.  Creatinine 0.83.  Total bili 1.7 -> 3.5. Direct bili 1.7. Alk phos 48 -> 58.  AST 72 ->280. ALT 41 -> 187.  Lipase 39.  Urine bilirubin negative.  Negative chest x-ray.  RUQ sonogram showed a 1.5 cm gallstone without evidence of acute cholecystitis with a dilated CBD measuring 12 mm without obvious obstruction or choledocholithiasis and hepatic steatosis was noted. An abdominal MRI/MRCP ordered, not yet completed.  Labs today; WBC 6.1.  Hemoglobin 14.  BUN 8.  Creatinine 1.01.  Total bili 5.4.  Alk phos 57.  AST 221.  ALT 278.  She developed nonbloody diarrhea 04/06/2022. Monday Friday, 04/07/2022 she developed RUQ pain which progressively worsened with severe around 8 PM she presented to the ED evaluation. She denied having any nausea until the EMS provider pushed on her RUQ.  No further nausea since then.  She denies having any vomiting since onset of RUQ pain.  She had a similar episode of RUQ pain approximately 7 years ago and she went to the ED at that time and was apparently diagnosed with gallstones.  No NSAID use.  Never had an EGD or colonoscopy.  Urine has been orange in color since admission.  She was evaluated by Dr. Stechschulte with general surgery who recommended a GI evaluation for ERCP prior to pursuing a cholecystectomy.   Past Medical  History:  Diagnosis Date   MS (multiple sclerosis)     Past Surgical History:  Procedure Laterality Date   CARPAL TUNNEL RELEASE Right    TONSILLECTOMY      Prior to Admission medications   Medication Sig Start Date End Date Taking? Authorizing Provider  AVONEX PREFILLED 30 MCG/0.5ML PSKT injection Inject 30 mcg into the muscle once a week. 02/03/20   [provider]  Cholecalciferol 50 MCG (2000 UT) CAPS Take 4,000 Units by mouth daily.    [provider]  lisinopril-hydrochlorothiazide (ZESTORETIC) 20-12.5 MG tablet Take 0.5 tablets by mouth daily. 04/24/21   Rudd, Stephen M, MD  Multiple Vitamin (MULTIVITAMIN) tablet Take 1 tablet by mouth daily.    [provider]    Current Facility-Administered Medications  Medication Dose Route Frequency Provider Last Rate Last Admin   acetaminophen (TYLENOL) tablet 650 mg  650 mg Oral Q6H PRN Tu, Ching T, DO       lisinopril (ZESTRIL) tablet 10 mg  10 mg Oral Daily Tu, Ching T, DO       And   hydrochlorothiazide (HYDRODIURIL) tablet 6.25 mg  6.25 mg Oral Daily Tu, Ching T, DO       morphine (PF) 2 MG/ML injection 2 mg  2 mg Intravenous Q3H PRN Tu, Ching T, DO   2 mg at 04/08/22 2350    Allergies as of 04/08/2022 - Review Complete 04/08/2022  Allergen Reaction Noted   Contrast media [iodinated contrast media]  06/07/2011   Copaxone [glatiramer acetate] Hives 02/02/2020      Family History  Problem Relation Age of Onset   Hypertension Mother    Stroke Father    Hypertension Father    Allergic rhinitis Sister    Multiple sclerosis Sister    Angioedema Neg Hx    Asthma Neg Hx    Atopy Neg Hx    Eczema Neg Hx    Immunodeficiency Neg Hx    Urticaria Neg Hx     Social History   Socioeconomic History   Marital status: Widowed    Spouse name: Not on file   Number of children: Not on file   Years of education: Not on file   Highest education level: Not on file  Occupational History   Occupation: Data  analysis manager    Employer: [employer]   Occupation: Carpentry    Comment: Self-employed  Tobacco Use   Smoking status: Never   Smokeless tobacco: Never  Vaping Use   Vaping Use: Never used  Substance and Sexual Activity   Alcohol use: No   Drug use: No   Sexual activity: Not Currently  Other Topics Concern   Not on file  Social History Narrative   Not on file   Social Determinants of Health   Financial Resource Strain: Not on file  Food Insecurity: No Food Insecurity (04/08/2022)   Hunger Vital Sign    Worried About Running Out of Food in the Last Year: Never true    Ran Out of Food in the Last Year: Never true  Transportation Needs: No Transportation Needs (04/08/2022)   PRAPARE - Transportation    Lack of Transportation (Medical): No    Lack of Transportation (Non-Medical): No  Physical Activity: Not on file  Stress: Not on file  Social Connections: Not on file  Intimate Partner Violence: Not At Risk (04/08/2022)   Humiliation, Afraid, Rape, and Kick questionnaire    Fear of Current or Ex-Partner: No    Emotionally Abused: No    Physically Abused: No    Sexually Abused: No    Review of Systems: Gen: Denies fever, sweats or chills. No weight loss.  CV: Denies chest pain, palpitations or edema. Resp: Denies cough, shortness of breath of hemoptysis.  GI: Denies heartburn, dysphagia, stomach or lower abdominal pain. No diarrhea or constipation. No rectal bleeding or melena.   GU : + Orange urine. MS: Denies joint pain, muscles aches or weakness. Derm: Denies rash, itchiness, skin lesions or unhealing ulcers. Psych: Denies depression, anxiety, memory loss or confusion. Heme: Denies easy bruising, bleeding. Neuro:  + MS. Endo:  Denies any problems with DM, thyroid or adrenal function.  Physical Exam: Vital signs in last 24 hours: Temp:  [98.3 F (36.8 C)-99.3 F (37.4 C)] 98.3 F (36.8 C) (04/07 0737) Pulse Rate:  [58-87] 58 (04/07 0737) Resp:  [16-18] 16 (04/07  0737) BP: (114-142)/(63-94) 114/63 (04/07 0737) SpO2:  [94 %-99 %] 95 % (04/07 0737) Weight:  [73.9 kg] 73.9 kg (04/06 1640) Last BM Date : 04/06/22 General:  Alert 61-year-old female in no acute distress. Head:  Normocephalic and atraumatic. Eyes:  No scleral icterus. Conjunctiva pink. Ears:  Normal auditory acuity. Nose:  No deformity, discharge or lesions. Mouth:  Dentition intact. No ulcers or lesions.  Neck:  Supple. No lymphadenopathy or thyromegaly.  Lungs: Breath sounds clear throughout. No wheezes, rhonchi or crackles.  Heart: Regular rate and rhythm, no murmurs. Abdomen: Soft, nondistended.  Nontender.  Positive bowel sounds to all 4 quadrants. Rectal: Deferred. Musculoskeletal:  Symmetrical without gross deformities.    Pulses:  Normal pulses noted. Extremities:  Without clubbing or edema. Neurologic:  Alert and  oriented x 4. No focal deficits.  Skin:  Slightly jaundiced without significant lesions or rashes. Psych:  Alert and cooperative. Normal mood and affect.  Intake/Output from previous day: 04/06 0701 - 04/07 0700 In: 1120.8 [P.O.:120; IV Piggyback:1000.8] Out: -  Intake/Output this shift: No intake/output data recorded.  Lab Results: Recent Labs    04/08/22 0052 04/08/22 0846 04/09/22 0433  WBC 13.3* 10.8* 6.1  HGB 14.9 14.5 14.0  HCT 42.5 43.2 40.1  PLT 212 198 182   BMET Recent Labs    04/08/22 0052 04/08/22 0846 04/09/22 0433  NA 138 139 140  K 3.5 4.0 3.9  CL 106 104 108  CO2 23 25 24  GLUCOSE 137* 122* 101*  BUN 15 11 8  CREATININE 0.83 0.94 1.01*  CALCIUM 9.5 9.6 9.0   LFT Recent Labs    04/08/22 0846 04/09/22 0433  PROT 7.1 6.7  ALBUMIN 3.8 3.4*  AST 280* 221*  ALT 187* 270*  ALKPHOS 56 57  BILITOT 3.5* 5.4*  BILIDIR 1.7*  --    PT/INR No results for input(s): "LABPROT", "INR" in the last 72 hours. Hepatitis Panel No results for input(s): "HEPBSAG", "HCVAB", "HEPAIGM", "HEPBIGM" in the last 72  hours.    Studies/Results: US Abdomen Limited RUQ (LIVER/GB)  Result Date: 04/08/2022 CLINICAL DATA:  Right upper quadrant pain. EXAM: ULTRASOUND ABDOMEN LIMITED RIGHT UPPER QUADRANT COMPARISON:  None Available. FINDINGS: Gallbladder: A 1.5 cm shadowing echogenic gallstone is seen within the dependent portion of the gallbladder lumen. The gallbladder wall measures 2.9 mm in thickness. No sonographic Murphy sign noted by sonographer. Common bile duct: Diameter: 12.0 mm Liver: No focal lesion identified. Diffusely increased echogenicity of the liver parenchyma is noted. Portal vein is patent on color Doppler imaging with normal direction of blood flow towards the liver. Other: None. IMPRESSION: 1. Cholelithiasis, without evidence of acute cholecystitis. 2. Dilated common bile duct without visualized obstructing lesion. Further evaluation with MRCP is recommended. 3. 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 Electronically Signed   By: Thaddeus  Houston M.D.   On: 04/08/2022 02:23   DG Chest 2 View  Result Date: 04/08/2022 CLINICAL DATA:  Chest tightness, upper abdominal pain EXAM: CHEST - 2 VIEW COMPARISON:  None Available. FINDINGS: Cardiac contours at the upper limit of normal. No focal pulmonary opacity. No pleural effusion or pneumothorax. No acute osseous abnormality. IMPRESSION: No acute cardiopulmonary process. Electronically Signed   By: Alison  Vasan M.D.   On: 04/08/2022 01:16    IMPRESSION/PLAN:  61-year-old female RUQ pain x 3 days. Nausea occurred after EMS pressed on her RUQ area otherwise no further nausea. No vomiting. Elevated T. Bil and LFTs. Total bili 1.7 -> 3.5 -> 5.4. Alk phos 48 -> 58 -> 57.  AST 72 ->280-> 221. ALT 41 -> 187-> 278. RUQ sonogram showed a 1.5 cm gallstone without evidence of acute cholecystitis with a dilated CBD measuring 12 mm without obvious obstruction but choledocholithiasis suspected. An abdominal MRI/MRCP ordered, not yet completed.  Seen by general surgery  who recommended GI evaluation/ERCP prior to pursuing a cholecystectomy. -Await MRI/MRCP results -ERCP scheduled tomorrow, benefits and risks discussed including risk with sedation, risk of bleeding, perforation, pancreatitis and infection. -Clear liquid diet -NPO af ter midnight -IV fluids and pain management per the hospitalist -Ondansetron 4mg po or IV Q 6hrs PRN -Await further recommendations per Dr. Dorsey  Hepatic steatosis per RUQ sonogram    History of multiple sclerosis  Marisela Line M Kennedy-Smith  04/09/2022, 8:53 AM      

## 2022-04-09 NOTE — Plan of Care (Signed)

## 2022-04-09 NOTE — Progress Notes (Signed)
Progress Note     Subjective: No abdominal pain currently. Did have some pain with ensure/ice cream last night. No n/v   Objective: Vital signs in last 24 hours: Temp:  [98.3 F (36.8 C)-99.3 F (37.4 C)] 98.3 F (36.8 C) (04/07 0737) Pulse Rate:  [58-87] 58 (04/07 0737) Resp:  [16] 16 (04/07 0737) BP: (114-142)/(63-94) 114/63 (04/07 0737) SpO2:  [94 %-99 %] 95 % (04/07 0737) Weight:  [73.9 kg] 73.9 kg (04/06 1640) Last BM Date : 04/06/22  Intake/Output from previous day: 04/06 0701 - 04/07 0700 In: 1120.8 [P.O.:120; IV Piggyback:1000.8] Out: -  Intake/Output this shift: No intake/output data recorded.  PE: General: pleasant, WD, female who is sitting up in bed in NAD Lungs: Respiratory effort nonlabored on room air Abd: soft, NT, ND MSK: all 4 extremities are symmetrical with no cyanosis, clubbing, or edema. Skin: warm and dry Psych: A&Ox3 with an appropriate affect.    Lab Results:  Recent Labs    04/08/22 0846 04/09/22 0433  WBC 10.8* 6.1  HGB 14.5 14.0  HCT 43.2 40.1  PLT 198 182   BMET Recent Labs    04/08/22 0846 04/09/22 0433  NA 139 140  K 4.0 3.9  CL 104 108  CO2 25 24  GLUCOSE 122* 101*  BUN 11 8  CREATININE 0.94 1.01*  CALCIUM 9.6 9.0   PT/INR No results for input(s): "LABPROT", "INR" in the last 72 hours. CMP     Component Value Date/Time   NA 140 04/09/2022 0433   K 3.9 04/09/2022 0433   CL 108 04/09/2022 0433   CO2 24 04/09/2022 0433   GLUCOSE 101 (H) 04/09/2022 0433   BUN 8 04/09/2022 0433   CREATININE 1.01 (H) 04/09/2022 0433   CALCIUM 9.0 04/09/2022 0433   PROT 6.7 04/09/2022 0433   ALBUMIN 3.4 (L) 04/09/2022 0433   AST 221 (H) 04/09/2022 0433   ALT 270 (H) 04/09/2022 0433   ALKPHOS 57 04/09/2022 0433   BILITOT 5.4 (H) 04/09/2022 0433   GFRNONAA >60 04/09/2022 0433   GFRAA >60 11/24/2015 1635   Lipase     Component Value Date/Time   LIPASE 39 04/08/2022 0052       Studies/Results: US Abdomen Limited RUQ  (LIVER/GB)  Result Date: 04/08/2022 CLINICAL DATA:  Right upper quadrant pain. EXAM: ULTRASOUND ABDOMEN LIMITED RIGHT UPPER QUADRANT COMPARISON:  None Available. FINDINGS: Gallbladder: A 1.5 cm shadowing echogenic gallstone is seen within the dependent portion of the gallbladder lumen. The gallbladder wall measures 2.9 mm in thickness. No sonographic Murphy sign noted by sonographer. Common bile duct: Diameter: 12.0 mm Liver: No focal lesion identified. Diffusely increased echogenicity of the liver parenchyma is noted. Portal vein is patent on color Doppler imaging with normal direction of blood flow towards the liver. Other: None. IMPRESSION: 1. Cholelithiasis, without evidence of acute cholecystitis. 2. Dilated common bile duct without visualized obstructing lesion. Further evaluation with MRCP is recommended. 3. 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 Electronically Signed   By: Aram Candela M.D.   On: 04/08/2022 02:23   DG Chest 2 View  Result Date: 04/08/2022 CLINICAL DATA:  Chest tightness, upper abdominal pain EXAM: CHEST - 2 VIEW COMPARISON:  None Available. FINDINGS: Cardiac contours at the upper limit of normal. No focal pulmonary opacity. No pleural effusion or pneumothorax. No acute osseous abnormality. IMPRESSION: No acute cardiopulmonary process. Electronically Signed   By: Wiliam Ke M.D.   On: 04/08/2022 01:16    Anti-infectives: Anti-infectives (From admission,  onward)    None        Assessment/Plan Cholelithiasis, Choledocholithiasis - T bili remains elevated - inc abdominal pain with ensure. Continue CLD  - GI following and planning for MRCP today and ERCP tomorrow - recommend lap chole this admission following above work up. Will follow  FEN: CLD, NPO after MN ID: none VTE: okay for chemical ppx from surgical standpoint  I reviewed Consultant GI notes, hospitalist notes, last 24 h vitals and pain scores, last 48 h intake and output, last 24 h labs and  trends, and last 24 h imaging results.    LOS: 1 day   Eric Form, [facility] Surgery 04/09/2022, 10:31 AM Please see Amion for pager number during day hours 7:00am-4:30pm

## 2022-04-09 NOTE — Consult Note (Addendum)
Referring Provider: Dr. Joycelyn Das Primary Care Physician:  Quita Skye, PA-C Primary Gastroenterologist: Gentry Fitz  Reason for Consultation: RUQ pain, elevated LFTs, suspected choledocholithiasis  HPI: KHARTER BORRIES is a 62 y.o. 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 Past surgical history includes past tonsillectomy and right carpal tunnel release. She presented to the ED with RUQ abdominal pain x 3 days. Labs in the ED showed a WBC count of 13.3 -> 10.8. Hemoglobin 14.9.  Platelet 212.  BUN 15.  Creatinine 0.83.  Total bili 1.7 -> 3.5. Direct bili 1.7. Alk phos 48 -> 58.  AST 72 ->280. ALT 41 -> 187.  Lipase 39.  Urine bilirubin negative.  Negative chest x-ray.  RUQ sonogram showed a 1.5 cm gallstone without evidence of acute cholecystitis with a dilated CBD measuring 12 mm without obvious obstruction or choledocholithiasis and hepatic steatosis was noted. An abdominal MRI/MRCP ordered, not yet completed.  Labs today; WBC 6.1.  Hemoglobin 14.  BUN 8.  Creatinine 1.01.  Total bili 5.4.  Alk phos 57.  AST 221.  ALT 278.  She developed nonbloody diarrhea 04/06/2022. Monday Friday, 04/07/2022 she developed RUQ pain which progressively worsened with severe around 8 PM she presented to the ED evaluation. She denied having any nausea until the EMS provider pushed on her RUQ.  No further nausea since then.  She denies having any vomiting since onset of RUQ pain.  She had a similar episode of RUQ pain approximately 7 years ago and she went to the ED at that time and was apparently diagnosed with gallstones.  No NSAID use.  Never had an EGD or colonoscopy.  Urine has been orange in color since admission.  She was evaluated by Dr. Dossie Der with general surgery who recommended a GI evaluation for ERCP prior to pursuing a cholecystectomy.   Past Medical  History:  Diagnosis Date   MS (multiple sclerosis)     Past Surgical History:  Procedure Laterality Date   CARPAL TUNNEL RELEASE Right    TONSILLECTOMY      Prior to Admission medications   Medication Sig Start Date End Date Taking? Authorizing Provider  AVONEX PREFILLED 30 MCG/0.5ML PSKT injection Inject 30 mcg into the muscle once a week. 02/03/20   [provider]  Cholecalciferol 50 MCG (2000 UT) CAPS Take 4,000 Units by mouth daily.    [provider]  lisinopril-hydrochlorothiazide (ZESTORETIC) 20-12.5 MG tablet Take 0.5 tablets by mouth daily. 04/24/21   Loyola Mast, MD  Multiple Vitamin (MULTIVITAMIN) tablet Take 1 tablet by mouth daily.    [provider]    Current Facility-Administered Medications  Medication Dose Route Frequency Provider Last Rate Last Admin   acetaminophen (TYLENOL) tablet 650 mg  650 mg Oral Q6H PRN Tu, Ching T, DO       lisinopril (ZESTRIL) tablet 10 mg  10 mg Oral Daily Tu, Ching T, DO       And   hydrochlorothiazide (HYDRODIURIL) tablet 6.25 mg  6.25 mg Oral Daily Tu, Ching T, DO       morphine (PF) 2 MG/ML injection 2 mg  2 mg Intravenous Q3H PRN Tu, Ching T, DO   2 mg at 04/08/22 2350    Allergies as of 04/08/2022 - Review Complete 04/08/2022  Allergen Reaction Noted   Contrast media [iodinated contrast media]  06/07/2011   Copaxone [glatiramer acetate] Hives 02/02/2020  Family History  Problem Relation Age of Onset   Hypertension Mother    Stroke Father    Hypertension Father    Allergic rhinitis Sister    Multiple sclerosis Sister    Angioedema Neg Hx    Asthma Neg Hx    Atopy Neg Hx    Eczema Neg Hx    Immunodeficiency Neg Hx    Urticaria Neg Hx     Social History   Socioeconomic History   Marital status: Widowed    Spouse name: Not on file   Number of children: Not on file   Years of education: Not on file   Highest education level: Not on file  Occupational History   Occupation: Magazine features editor    Employer: [employer]   Occupation: Carpentry    Comment: Self-employed  Tobacco Use   Smoking status: Never   Smokeless tobacco: Never  Vaping Use   Vaping Use: Never used  Substance and Sexual Activity   Alcohol use: No   Drug use: No   Sexual activity: Not Currently  Other Topics Concern   Not on file  Social History Narrative   Not on file   Social Determinants of Health   Financial Resource Strain: Not on file  Food Insecurity: No Food Insecurity (04/08/2022)   Hunger Vital Sign    Worried About Running Out of Food in the Last Year: Never true    Ran Out of Food in the Last Year: Never true  Transportation Needs: No Transportation Needs (04/08/2022)   PRAPARE - Administrator, Civil Service (Medical): No    Lack of Transportation (Non-Medical): No  Physical Activity: Not on file  Stress: Not on file  Social Connections: Not on file  Intimate Partner Violence: Not At Risk (04/08/2022)   Humiliation, Afraid, Rape, and Kick questionnaire    Fear of Current or Ex-Partner: No    Emotionally Abused: No    Physically Abused: No    Sexually Abused: No    Review of Systems: Gen: Denies fever, sweats or chills. No weight loss.  CV: Denies chest pain, palpitations or edema. Resp: Denies cough, shortness of breath of hemoptysis.  GI: Denies heartburn, dysphagia, stomach or lower abdominal pain. No diarrhea or constipation. No rectal bleeding or melena.   GU : + Orange urine. MS: Denies joint pain, muscles aches or weakness. Derm: Denies rash, itchiness, skin lesions or unhealing ulcers. Psych: Denies depression, anxiety, memory loss or confusion. Heme: Denies easy bruising, bleeding. Neuro:  + MS. Endo:  Denies any problems with DM, thyroid or adrenal function.  Physical Exam: Vital signs in last 24 hours: Temp:  [98.3 F (36.8 C)-99.3 F (37.4 C)] 98.3 F (36.8 C) (04/07 0737) Pulse Rate:  [58-87] 58 (04/07 0737) Resp:  [16-18] 16 (04/07  0737) BP: (114-142)/(63-94) 114/63 (04/07 0737) SpO2:  [94 %-99 %] 95 % (04/07 0737) Weight:  [73.9 kg] 73.9 kg (04/06 1640) Last BM Date : 04/06/22 General:  Alert 63 year old female in no acute distress. Head:  Normocephalic and atraumatic. Eyes:  No scleral icterus. Conjunctiva pink. Ears:  Normal auditory acuity. Nose:  No deformity, discharge or lesions. Mouth:  Dentition intact. No ulcers or lesions.  Neck:  Supple. No lymphadenopathy or thyromegaly.  Lungs: Breath sounds clear throughout. No wheezes, rhonchi or crackles.  Heart: Regular rate and rhythm, no murmurs. Abdomen: Soft, nondistended.  Nontender.  Positive bowel sounds to all 4 quadrants. Rectal: Deferred. Musculoskeletal:  Symmetrical without gross deformities.  Pulses:  Normal pulses noted. Extremities:  Without clubbing or edema. Neurologic:  Alert and  oriented x 4. No focal deficits.  Skin:  Slightly jaundiced without significant lesions or rashes. Psych:  Alert and cooperative. Normal mood and affect.  Intake/Output from previous day: 04/06 0701 - 04/07 0700 In: 1120.8 [P.O.:120; IV Piggyback:1000.8] Out: -  Intake/Output this shift: No intake/output data recorded.  Lab Results: Recent Labs    04/08/22 0052 04/08/22 0846 04/09/22 0433  WBC 13.3* 10.8* 6.1  HGB 14.9 14.5 14.0  HCT 42.5 43.2 40.1  PLT 212 198 182   BMET Recent Labs    04/08/22 0052 04/08/22 0846 04/09/22 0433  NA 138 139 140  K 3.5 4.0 3.9  CL 106 104 108  CO2 23 25 24   GLUCOSE 137* 122* 101*  BUN 15 11 8   CREATININE 0.83 0.94 1.01*  CALCIUM 9.5 9.6 9.0   LFT Recent Labs    04/08/22 0846 04/09/22 0433  PROT 7.1 6.7  ALBUMIN 3.8 3.4*  AST 280* 221*  ALT 187* 270*  ALKPHOS 56 57  BILITOT 3.5* 5.4*  BILIDIR 1.7*  --    PT/INR No results for input(s): "LABPROT", "INR" in the last 72 hours. Hepatitis Panel No results for input(s): "HEPBSAG", "HCVAB", "HEPAIGM", "HEPBIGM" in the last 72  hours.    Studies/Results: US Abdomen Limited RUQ (LIVER/GB)  Result Date: 04/08/2022 CLINICAL DATA:  Right upper quadrant pain. EXAM: ULTRASOUND ABDOMEN LIMITED RIGHT UPPER QUADRANT COMPARISON:  None Available. FINDINGS: Gallbladder: A 1.5 cm shadowing echogenic gallstone is seen within the dependent portion of the gallbladder lumen. The gallbladder wall measures 2.9 mm in thickness. No sonographic Murphy sign noted by sonographer. Common bile duct: Diameter: 12.0 mm Liver: No focal lesion identified. Diffusely increased echogenicity of the liver parenchyma is noted. Portal vein is patent on color Doppler imaging with normal direction of blood flow towards the liver. Other: None. IMPRESSION: 1. Cholelithiasis, without evidence of acute cholecystitis. 2. Dilated common bile duct without visualized obstructing lesion. Further evaluation with MRCP is recommended. 3. 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 Electronically Signed   By: Aram Candelahaddeus  Houston M.D.   On: 04/08/2022 02:23   DG Chest 2 View  Result Date: 04/08/2022 CLINICAL DATA:  Chest tightness, upper abdominal pain EXAM: CHEST - 2 VIEW COMPARISON:  None Available. FINDINGS: Cardiac contours at the upper limit of normal. No focal pulmonary opacity. No pleural effusion or pneumothorax. No acute osseous abnormality. IMPRESSION: No acute cardiopulmonary process. Electronically Signed   By: Wiliam KeAlison  Vasan M.D.   On: 04/08/2022 01:16    IMPRESSION/PLAN:  62 year old female RUQ pain x 3 days. Nausea occurred after EMS pressed on her RUQ area otherwise no further nausea. No vomiting. Elevated T. Bil and LFTs. Total bili 1.7 -> 3.5 -> 5.4. Alk phos 48 -> 58 -> 57.  AST 72 ->280-> 221. ALT 41 -> 187-> 278. RUQ sonogram showed a 1.5 cm gallstone without evidence of acute cholecystitis with a dilated CBD measuring 12 mm without obvious obstruction but choledocholithiasis suspected. An abdominal MRI/MRCP ordered, not yet completed.  Seen by general surgery  who recommended GI evaluation/ERCP prior to pursuing a cholecystectomy. -Await MRI/MRCP results -ERCP scheduled tomorrow, benefits and risks discussed including risk with sedation, risk of bleeding, perforation, pancreatitis and infection. -Clear liquid diet -NPO af ter midnight -IV fluids and pain management per the hospitalist -Ondansetron 4mg  po or IV Q 6hrs PRN -Await further recommendations per Dr. Leonides Schanzorsey  Hepatic steatosis per RUQ sonogram  History of multiple sclerosis  Arnaldo Natal  04/09/2022, 8:53 AM

## 2022-04-09 NOTE — Hospital Course (Signed)
Angela Monroe is a 62 y.o. female with medical history significant of  multiple sclerosis on Avonex, hypertension presented to hospital with abdominal pain for 4 days prior to presentation with nausea and 1 episode of vomiting.  Abdominal pain which was acute in onset after her dinner.  In the ED, initial labs on initial presentation on 04/08/2022 showed total bilirubin elevated at 1.7 with AST of 72 WBC was elevated at 13.3.  Urinalysis showed some ketones.  EKG showed normal sinus rhythm.  Ultrasound of the right upper quadrant showed cholelithiasis without cholecystitis but dilated common bile duct.  Chest x-ray was negative for acute findings.  Patient was then considered for admission to hospital but then decided to leave AGAINST MEDICAL ADVICE but subsequently returned to the hospital for further evaluation and treatment.  Assessment and plan.   Right upper quadrant pain nausea vomiting.  History of gallstones elevated LFT.  Ultrasound showing cholelithiasis without cholecystitis.  Mild elevation of LFTs on presentation.   CBD dilation noted on the ultrasound.  MRCP pending.  [facility] notified for possible ERCP.  GI has been consulted for possible ERCP.  General surgery on board as well.    Currently  IV fluids analgesics and symptomatic care.  Trend LFTs in AM.   History of hypertension On lisinopril hydrochlorothiazide    History of multiple sclerosis on Avenox

## 2022-04-10 ENCOUNTER — Inpatient Hospital Stay (HOSPITAL_COMMUNITY): Payer: No Typology Code available for payment source | Admitting: Anesthesiology

## 2022-04-10 ENCOUNTER — Encounter (HOSPITAL_COMMUNITY): Payer: Self-pay | Admitting: Family Medicine

## 2022-04-10 ENCOUNTER — Encounter (HOSPITAL_COMMUNITY)
Admission: EM | Disposition: A | Discharge status: Home / Self Care | Payer: Self-pay | Source: Home / Self Care | Attending: Internal Medicine

## 2022-04-10 ENCOUNTER — Inpatient Hospital Stay (HOSPITAL_COMMUNITY): Payer: No Typology Code available for payment source

## 2022-04-10 DIAGNOSIS — K8070 Calculus of gallbladder and bile duct without cholecystitis without obstruction: Secondary | ICD-10-CM | POA: Diagnosis not present

## 2022-04-10 DIAGNOSIS — K812 Acute cholecystitis with chronic cholecystitis: Secondary | ICD-10-CM | POA: Diagnosis not present

## 2022-04-10 DIAGNOSIS — G35 Multiple sclerosis: Secondary | ICD-10-CM | POA: Diagnosis not present

## 2022-04-10 DIAGNOSIS — K8050 Calculus of bile duct without cholangitis or cholecystitis without obstruction: Secondary | ICD-10-CM | POA: Diagnosis not present

## 2022-04-10 DIAGNOSIS — R7989 Other specified abnormal findings of blood chemistry: Secondary | ICD-10-CM

## 2022-04-10 DIAGNOSIS — I10 Essential (primary) hypertension: Secondary | ICD-10-CM | POA: Diagnosis not present

## 2022-04-10 HISTORY — PX: ERCP: SHX5425

## 2022-04-10 HISTORY — PX: SPHINCTEROTOMY: SHX5544

## 2022-04-10 HISTORY — PX: REMOVAL OF STONES: SHX5545

## 2022-04-10 HISTORY — PX: BILIARY DILATION: SHX6850

## 2022-04-10 LAB — COMPREHENSIVE METABOLIC PANEL
ALT: 241 U/L — ABNORMAL HIGH (ref 0–44)
AST: 148 U/L — ABNORMAL HIGH (ref 15–41)
Albumin: 3.7 g/dL (ref 3.5–5.0)
Alkaline Phosphatase: 83 U/L (ref 38–126)
Anion gap: 10 (ref 5–15)
BUN: 7 mg/dL — ABNORMAL LOW (ref 8–23)
CO2: 23 mmol/L (ref 22–32)
Calcium: 9.2 mg/dL (ref 8.9–10.3)
Chloride: 105 mmol/L (ref 98–111)
Creatinine, Ser: 0.88 mg/dL (ref 0.44–1.00)
GFR, Estimated: >60 mL/min (ref >60)
Glucose, Bld: 99 mg/dL (ref 70–99)
Potassium: 3.9 mmol/L (ref 3.5–5.1)
Sodium: 138 mmol/L (ref 135–145)
Total Bilirubin: 2.9 mg/dL — ABNORMAL HIGH (ref 0.3–1.2)
Total Protein: 7.1 g/dL (ref 6.5–8.1)

## 2022-04-10 LAB — CBC
HCT: 44 % (ref 36.0–46.0)
Hemoglobin: 14.5 g/dL (ref 12.0–15.0)
MCH: 29.4 pg (ref 26.0–34.0)
MCHC: 33 g/dL (ref 30.0–36.0)
MCV: 89.2 fL (ref 80.0–100.0)
Platelets: 215 10*3/uL (ref 150–400)
RBC: 4.93 MIL/uL (ref 3.87–5.11)
RDW: 13.2 % (ref 11.5–15.5)
WBC: 5.6 10*3/uL (ref 4.0–10.5)
nRBC: 0 % (ref 0.0–0.2)

## 2022-04-10 LAB — MAGNESIUM: Magnesium: 2.2 mg/dL (ref 1.7–2.4)

## 2022-04-10 SURGERY — ERCP, WITH INTERVENTION IF INDICATED
Anesthesia: General

## 2022-04-10 MED ORDER — CIPROFLOXACIN IN D5W 400 MG/200ML IV SOLN
INTRAVENOUS | Status: AC
  Filled 2022-04-10: qty 200

## 2022-04-10 MED ORDER — SUGAMMADEX SODIUM 200 MG/2ML IV SOLN
INTRAVENOUS | Status: DC | PRN
  Administered 2022-04-10: 200 mg via INTRAVENOUS

## 2022-04-10 MED ORDER — GLUCAGON HCL RDNA (DIAGNOSTIC) 1 MG IJ SOLR
INTRAMUSCULAR | Status: AC
  Filled 2022-04-10: qty 1

## 2022-04-10 MED ORDER — PROPOFOL 500 MG/50ML IV EMUL: INTRAVENOUS | Status: DC | PRN

## 2022-04-10 MED ORDER — ROCURONIUM BROMIDE 10 MG/ML (PF) SYRINGE
PREFILLED_SYRINGE | INTRAVENOUS | Status: DC | PRN
  Administered 2022-04-10: 50 mg via INTRAVENOUS

## 2022-04-10 MED ORDER — INDOMETHACIN 50 MG RE SUPP
RECTAL | Status: AC
  Filled 2022-04-10: qty 2

## 2022-04-10 MED ORDER — DICLOFENAC SUPPOSITORY 100 MG
RECTAL | Status: AC
  Filled 2022-04-10: qty 1

## 2022-04-10 MED ORDER — METHYLPREDNISOLONE SODIUM SUCC 125 MG IJ SOLR: 40.0000 mg | Freq: Every day | INTRAMUSCULAR | Status: AC

## 2022-04-10 MED ORDER — GLUCAGON HCL RDNA (DIAGNOSTIC) 1 MG IJ SOLR
INTRAMUSCULAR | Status: DC | PRN
  Administered 2022-04-10 (×2): .25 mg via INTRAVENOUS

## 2022-04-10 MED ORDER — SODIUM CHLORIDE 0.9 % IV SOLN: INTRAVENOUS | Status: DC | PRN

## 2022-04-10 MED ORDER — LIDOCAINE 2% (20 MG/ML) 5 ML SYRINGE
INTRAMUSCULAR | Status: DC | PRN
  Administered 2022-04-10: 40 mg via INTRAVENOUS

## 2022-04-10 MED ORDER — METHYLPREDNISOLONE SODIUM SUCC 125 MG IJ SOLR
INTRAMUSCULAR | Status: AC
  Filled 2022-04-10: qty 2

## 2022-04-10 MED ORDER — INDOMETHACIN 50 MG RE SUPP
RECTAL | Status: DC | PRN
  Administered 2022-04-10: 100 mg via RECTAL

## 2022-04-10 MED ORDER — SODIUM CHLORIDE 0.9 % IV SOLN
INTRAVENOUS | Status: DC | PRN
  Administered 2022-04-10: 25 mL

## 2022-04-10 MED ORDER — ONDANSETRON HCL 4 MG/2ML IJ SOLN
INTRAMUSCULAR | Status: DC | PRN
  Administered 2022-04-10: 4 mg via INTRAVENOUS

## 2022-04-10 MED ORDER — CIPROFLOXACIN IN D5W 400 MG/200ML IV SOLN
INTRAVENOUS | Status: DC | PRN
  Administered 2022-04-10: 400 mg via INTRAVENOUS

## 2022-04-10 MED ORDER — PROPOFOL 10 MG/ML IV BOLUS
INTRAVENOUS | Status: DC | PRN
  Administered 2022-04-10: 150 mg via INTRAVENOUS

## 2022-04-10 NOTE — Anesthesia Preprocedure Evaluation (Signed)
Anesthesia Evaluation  Patient identified by MRN, date of birth, ID band Patient awake    Reviewed: Allergy & Precautions, H&P , NPO status , Patient's Chart, lab work & pertinent test results  Airway Mallampati: II   Neck ROM: full    Dental   Pulmonary neg pulmonary ROS   breath sounds clear to auscultation       Cardiovascular hypertension,  Rhythm:regular Rate:Normal     Neuro/Psych Multiple sclerosis    GI/Hepatic Dilated CBD   Endo/Other    Renal/GU      Musculoskeletal   Abdominal   Peds  Hematology   Anesthesia Other Findings   Reproductive/Obstetrics                             Anesthesia Physical Anesthesia Plan  ASA: 2  Anesthesia Plan: General   Post-op Pain Management:    Induction: Intravenous  PONV Risk Score and Plan: Ondansetron, Dexamethasone, Midazolam and Treatment may vary due to age or medical condition  Airway Management Planned: Oral ETT  Additional Equipment:   Intra-op Plan:   Post-operative Plan: Extubation in OR  Informed Consent: I have reviewed the patients History and Physical, chart, labs and discussed the procedure including the risks, benefits and alternatives for the proposed anesthesia with the patient or authorized representative who has indicated his/her understanding and acceptance.     Dental advisory given  Plan Discussed with: CRNA, Anesthesiologist and Surgeon  Anesthesia Plan Comments:        Anesthesia Quick Evaluation

## 2022-04-10 NOTE — Anesthesia Procedure Notes (Signed)
Procedure Name: Intubation Date/Time: 04/10/2022 1:35 PM  Performed by: Nadara Mustard, CRNAPre-anesthesia Checklist: Patient identified, Emergency Drugs available, Suction available and Patient being monitored Patient Re-evaluated:Patient Re-evaluated prior to induction Oxygen Delivery Method: Circle system utilized Preoxygenation: Pre-oxygenation with 100% oxygen Induction Type: IV induction Ventilation: Mask ventilation without difficulty Laryngoscope Size: Mac and 4 Grade View: Grade I Tube type: Oral Tube size: 7.5 mm Number of attempts: 1 Airway Equipment and Method: Stylet and Oral airway Placement Confirmation: ETT inserted through vocal cords under direct vision, positive ETCO2 and breath sounds checked- equal and bilateral Tube secured with: Tape Dental Injury: Teeth and Oropharynx as per pre-operative assessment

## 2022-04-10 NOTE — Progress Notes (Signed)
PROGRESS NOTE    JESSAMYN LETSON  DXI:[id number] DOB: [birth year] DOA: 04/08/2022 PCP: Quita Skye, PA-C    Brief Narrative:  Angela Monroe is a 62 y.o. female with medical history significant of  multiple sclerosis on Avonex, hypertension presented to hospital with abdominal pain for 4 days prior to presentation with nausea and 1 episode of vomiting.  Abdominal pain which was acute in onset after her dinner.  In the ED, initial labs on initial presentation on 04/08/2022 showed total bilirubin elevated at 1.7 with AST of 72 WBC was elevated at 13.3.  Urinalysis showed some ketones.  EKG showed normal sinus rhythm.  Ultrasound of the right upper quadrant showed cholelithiasis without cholecystitis but dilated common bile duct.  Chest x-ray was negative for acute findings.  Patient was then considered for admission to hospital but then decided to leave AGAINST MEDICAL ADVICE but subsequently returned to the hospital for further evaluation and treatment.  At this time, patient has undergone MRCP and awaiting for ERCP.  Will likely need cholecystectomy during this admission.  Assessment and plan.   Right upper quadrant pain, nausea vomiting.  History of gallstones elevated LFT.   Currently  IV fluids analgesics and symptomatic care. Ultrasound showing cholelithiasis without cholecystitis.  Mild elevation of LFTs on presentation.   CBD dilation noted on the ultrasound.  MRCP revealed cholelithiasis and possible early cholecystitis, choledocholithiasis with 6 mm stone in the distal CBD with proximal intra and extrahepatic biliary dilatation.GI on board for ERCP today.  Will likely need cholecystectomy this admission.    Trending down LFTs.    History of hypertension On lisinopril hydrochlorothiazide    History of multiple sclerosis on Avenox      DVT prophylaxis: SCDs Start: 04/08/22 1937   Code Status:     Code Status: Full Code  Disposition: Home likely in 2 to 3 days  Status is:  Inpatient  Remains inpatient appropriate because: CBD dilatation, cholelithiasis with choledocholithiasis, awaiting ERCP and possible surgery   Family Communication: None at bedside  Consultants:  General surgery GI  Procedures:  MRCP  Antimicrobials:  None  Anti-infectives (From admission, onward)    None        Subjective: Today, patient was seen and examined at bedside.  Denies any pain nausea vomiting fever chills.  Awaiting for ERCP.   Objective: Vitals:   04/09/22 1642 04/09/22 1920 04/10/22 0427 04/10/22 0820  BP: 132/73 132/73 131/80 (!) 147/74  Pulse: (!) 56 (!) 57 (!) 54 (!) 56  Resp:  16 16 20   Temp: 98.9 F (37.2 C) 98.1 F (36.7 C) 97.8 F (36.6 C) 98 F (36.7 C)  TempSrc: Oral Oral Oral Oral  SpO2: 97% 100% 97% 97%  Weight:      Height:       No intake or output data in the 24 hours ending 04/10/22 0944  Filed Weights   04/08/22 1640  Weight: 73.9 kg    Physical Examination: Body mass index is 27.11 kg/m.   General:  Average built, not in obvious distress HENT:   Mild icteric. Chest:  Clear breath sounds.  Diminished breath sounds bilaterally. No crackles or wheezes.  CVS: S1 &S2 heard. No murmur.  Regular rate and rhythm. Abdomen: Soft, nontender, nondistended.  Bowel sounds are heard.   Extremities: No cyanosis, clubbing or edema.  Peripheral pulses are palpable. Psych: Alert, awake and oriented, normal mood CNS:  No cranial nerve deficits.  Power equal in all extremities.   Skin:  Warm and dry.  No rashes noted.  Data Reviewed:   CBC: Recent Labs  Lab 04/08/22 0052 04/08/22 0846 04/09/22 0433 04/10/22 0440  WBC 13.3* 10.8* 6.1 5.6  NEUTROABS 11.1* 8.9*  --   --   HGB 14.9 14.5 14.0 14.5  HCT 42.5 43.2 40.1 44.0  MCV 86.6 87.8 87.7 89.2  PLT 212 198 182 215     Basic Metabolic Panel: Recent Labs  Lab 04/08/22 0052 04/08/22 0846 04/09/22 0433 04/10/22 0440  NA 138 139 140 138  K 3.5 4.0 3.9 3.9  CL 106 104 108  105  CO2 23 25 24 23   GLUCOSE 137* 122* 101* 99  BUN 15 11 8  7*  CREATININE 0.83 0.94 1.01* 0.88  CALCIUM 9.5 9.6 9.0 9.2  MG  --  2.0  --  2.2     Liver Function Tests: Recent Labs  Lab 04/08/22 0052 04/08/22 0846 04/09/22 0433 04/10/22 0440  AST 72* 280* 221* 148*  ALT 41 187* 270* 241*  ALKPHOS 48 56 57 83  BILITOT 1.7* 3.5* 5.4* 2.9*  PROT 7.2 7.1 6.7 7.1  ALBUMIN 4.0 3.8 3.4* 3.7      Radiology Studies: MR ABDOMEN MRCP WO CONTRAST  Result Date: 04/09/2022 CLINICAL DATA:  62 year old female with right upper quadrant abdominal pain. EXAM: MRI ABDOMEN WITHOUT CONTRAST  (INCLUDING MRCP) TECHNIQUE: Multiplanar multisequence MR imaging of the abdomen was performed. Heavily T2-weighted images of the biliary and pancreatic ducts were obtained, and three-dimensional MRCP images were rendered by post processing. COMPARISON:  No prior abdominal MRI. Abdominal ultrasound 04/08/2022. FINDINGS: Comment: Today's study is limited for detection and characterization of visceral and/or vascular lesions by lack of IV gadolinium. Lower chest: Unremarkable. Hepatobiliary: Out of phase imaging demonstrates diffuse loss of signal intensity throughout the hepatic parenchyma, indicative of a background of hepatic steatosis. No discrete cystic or solid hepatic lesions are confidently identified on today's noncontrast examination. MRCP images demonstrate mild intra and extrahepatic biliary ductal dilatation with the common bile duct measuring up to 10 mm in the porta hepatis. In the distal common bile duct (coronal MRCP image 95 of series 9) there is a 6 mm filling defect, compatible with choledocholithiasis. Multiple small filling defects are also noted lying dependently in the gallbladder measuring up to 13 mm in diameter. Gallbladder is moderately distended. Gallbladder wall appears mildly edematous with trace volume of pericholecystic fluid concerning for early acute cholecystitis. Pancreas: No definite  pancreatic mass identified on today's noncontrast examination. No pancreatic ductal dilatation. No peripancreatic fluid collections or inflammatory changes. Spleen:  Unremarkable. Adrenals/Urinary Tract: Bilateral kidneys and adrenal glands are normal in appearance on today's noncontrast examination. No hydroureteronephrosis in the visualized portions of the abdomen. Stomach/Bowel: Numerous colonic diverticula are noted. Visualized portions are otherwise unremarkable. Vascular/Lymphatic: No aneurysm identified in the visualized abdominal vasculature. No lymphadenopathy noted in the abdomen. Other: No significant volume of ascites noted in the visualized portions of the peritoneal cavity. Musculoskeletal: No aggressive appearing osseous lesions are noted in the visualized portions of the skeleton. IMPRESSION: 1. Cholelithiasis with imaging findings concerning for early acute cholecystitis. Notably, however, there was no sonographic Murphy's sign on the recent ultrasound examination. Further clinical evaluation is suggested. 2. Study is positive for choledocholithiasis with a 6 mm stone in the distal common bile duct with mild proximal intra and extrahepatic biliary ductal dilatation indicating mild obstruction. 3. Colonic diverticulosis. 4. Hepatic steatosis. Electronically Signed   By: Trudie Reed M.D.   On: 04/09/2022 11:42  LOS: 2 days    Joycelyn DasLaxman Jing Howatt, MD [facility] Available via Epic secure chat 7am-7pm After these hours, please refer to coverage provider listed on [email or web address] 04/10/2022, 9:44 AM

## 2022-04-10 NOTE — Progress Notes (Signed)
Progress Note     Subjective: Tolerated clears without pain yesterday. NPO for ercp. No n/v. No abdominal pain at rest. RUQ abd pain with palpation  Objective: Vital signs in last 24 hours: Temp:  [97.8 F (36.6 C)-98.9 F (37.2 C)] 98 F (36.7 C) (04/08 0820) Pulse Rate:  [54-57] 56 (04/08 0820) Resp:  [16-20] 20 (04/08 0820) BP: (131-147)/(73-80) 147/74 (04/08 0820) SpO2:  [97 %-100 %] 97 % (04/08 0820) Last BM Date : 04/06/22  Intake/Output from previous day: No intake/output data recorded. Intake/Output this shift: No intake/output data recorded.  PE: General: pleasant, WD, female who is laying in bed in NAD Lungs: Respiratory effort nonlabored on room air Abd: soft, ND, mild TTP RUQ without rebound or guarding MSK: all 4 extremities are symmetrical with no cyanosis, clubbing, or edema. Skin: warm and dry Psych: A&Ox3 with an appropriate affect.    Lab Results:  Recent Labs    04/09/22 0433 04/10/22 0440  WBC 6.1 5.6  HGB 14.0 14.5  HCT 40.1 44.0  PLT 182 215    BMET Recent Labs    04/09/22 0433 04/10/22 0440  NA 140 138  K 3.9 3.9  CL 108 105  CO2 24 23  GLUCOSE 101* 99  BUN 8 7*  CREATININE 1.01* 0.88  CALCIUM 9.0 9.2    PT/INR No results for input(s): "LABPROT", "INR" in the last 72 hours. CMP     Component Value Date/Time   NA 138 04/10/2022 0440   K 3.9 04/10/2022 0440   CL 105 04/10/2022 0440   CO2 23 04/10/2022 0440   GLUCOSE 99 04/10/2022 0440   BUN 7 (L) 04/10/2022 0440   CREATININE 0.88 04/10/2022 0440   CALCIUM 9.2 04/10/2022 0440   PROT 7.1 04/10/2022 0440   ALBUMIN 3.7 04/10/2022 0440   AST 148 (H) 04/10/2022 0440   ALT 241 (H) 04/10/2022 0440   ALKPHOS 83 04/10/2022 0440   BILITOT 2.9 (H) 04/10/2022 0440   GFRNONAA >60 04/10/2022 0440   GFRAA >60 11/24/2015 1635   Lipase     Component Value Date/Time   LIPASE 39 04/08/2022 0052       Studies/Results: MR ABDOMEN MRCP WO CONTRAST  Result Date:  04/09/2022 CLINICAL DATA:  62 year old female with right upper quadrant abdominal pain. EXAM: MRI ABDOMEN WITHOUT CONTRAST  (INCLUDING MRCP) TECHNIQUE: Multiplanar multisequence MR imaging of the abdomen was performed. Heavily T2-weighted images of the biliary and pancreatic ducts were obtained, and three-dimensional MRCP images were rendered by post processing. COMPARISON:  No prior abdominal MRI. Abdominal ultrasound 04/08/2022. FINDINGS: Comment: Today's study is limited for detection and characterization of visceral and/or vascular lesions by lack of IV gadolinium. Lower chest: Unremarkable. Hepatobiliary: Out of phase imaging demonstrates diffuse loss of signal intensity throughout the hepatic parenchyma, indicative of a background of hepatic steatosis. No discrete cystic or solid hepatic lesions are confidently identified on today's noncontrast examination. MRCP images demonstrate mild intra and extrahepatic biliary ductal dilatation with the common bile duct measuring up to 10 mm in the porta hepatis. In the distal common bile duct (coronal MRCP image 95 of series 9) there is a 6 mm filling defect, compatible with choledocholithiasis. Multiple small filling defects are also noted lying dependently in the gallbladder measuring up to 13 mm in diameter. Gallbladder is moderately distended. Gallbladder wall appears mildly edematous with trace volume of pericholecystic fluid concerning for early acute cholecystitis. Pancreas: No definite pancreatic mass identified on today's noncontrast examination. No pancreatic ductal dilatation.  No peripancreatic fluid collections or inflammatory changes. Spleen:  Unremarkable. Adrenals/Urinary Tract: Bilateral kidneys and adrenal glands are normal in appearance on today's noncontrast examination. No hydroureteronephrosis in the visualized portions of the abdomen. Stomach/Bowel: Numerous colonic diverticula are noted. Visualized portions are otherwise unremarkable.  Vascular/Lymphatic: No aneurysm identified in the visualized abdominal vasculature. No lymphadenopathy noted in the abdomen. Other: No significant volume of ascites noted in the visualized portions of the peritoneal cavity. Musculoskeletal: No aggressive appearing osseous lesions are noted in the visualized portions of the skeleton. IMPRESSION: 1. Cholelithiasis with imaging findings concerning for early acute cholecystitis. Notably, however, there was no sonographic Murphy's sign on the recent ultrasound examination. Further clinical evaluation is suggested. 2. Study is positive for choledocholithiasis with a 6 mm stone in the distal common bile duct with mild proximal intra and extrahepatic biliary ductal dilatation indicating mild obstruction. 3. Colonic diverticulosis. 4. Hepatic steatosis. Electronically Signed   By: Trudie Reed M.D.   On: 04/09/2022 11:42    Anti-infectives: Anti-infectives (From admission, onward)    None        Assessment/Plan Cholelithiasis, Choledocholithiasis - T bili remains elevated but improved at 2.9 - MRCP 4/7 with choledocholithiasis and findings concerning for early cholecystitis. She is mildly TTP on exam today which is new - can have CLD from our standpoint after ERCP then NPO MN for lap chole - I have explained the procedure, risks, and aftercare of cholecystectomy.  Risks include but are not limited to bleeding, infection, wound problems, anesthesia, diarrhea, bile leak, injury to common bile duct/liver/intestine. She seems to understand and agrees to proceed.   FEN: NPO after MN ID: none VTE: okay for chemical ppx from surgical standpoint  I reviewed Consultant GI notes, hospitalist notes, last 24 h vitals and pain scores, last 48 h intake and output, last 24 h labs and trends, and last 24 h imaging results.    LOS: 2 days   Eric Form, [facility] 04/10/2022, 9:05 AM Please see Amion for pager number during day hours  7:00am-4:30pm

## 2022-04-10 NOTE — Plan of Care (Signed)

## 2022-04-10 NOTE — Progress Notes (Signed)
  Transition of Care [facility]) Screening Note   Patient Details  Name: Angela Monroe Date of Birth: [birth year]   Transition of [facility]) CM/SW Contact:    Harriet Masson, RN Phone Number: 04/10/2022, 9:45 AM    Transition of Care Department [facility]) has reviewed patient and no TOC needs have been identified at this time. We will continue to monitor patient advancement through interdisciplinary progression rounds. If new patient transition needs arise, please place a TOC consult.

## 2022-04-10 NOTE — Op Note (Addendum)
[facility] Patient Name: Angela Monroe Procedure Date : 04/10/2022 MRN: [id number] Attending MD: Lynann Bologna , MD, [id number] Date of Birth: 1960-03-11 CSN: [id number] Age: 62 Admit Type: Inpatient Procedure:                ERCP Indications:              Bile duct stone(s) on MRCP with abnormal LFTs Providers:                Lynann Bologna, MD, Lorenza Evangelist, RN, Kandice Robinsons, Technician Referring MD:              Medicines:                General Anesthesia, Cipro 400 mg IV, glucagon 0.75                            mg, Indocin suppositories. Pt with ? Contrast                            allergy-I offered her Solu-Medrol but she declined. Complications:            No immediate complications. No allergic reaction to                            contrast Estimated Blood Loss:     Estimated blood loss: none. Procedure:                Pre-Anesthesia Assessment:                           - Prior to the procedure, a History and Physical                            was performed, and patient medications and                            allergies were reviewed. The patient's tolerance of                            previous anesthesia was also reviewed. The risks                            and benefits of the procedure and the sedation                            options and risks were discussed with the patient.                            All questions were answered, and informed consent                            was obtained. Prior Anticoagulants: The patient has  taken no anticoagulant or antiplatelet agents. ASA                            Grade Assessment: II - A patient with mild systemic                            disease. After reviewing the risks and benefits,                            the patient was deemed in satisfactory condition to                            undergo the procedure.                           After  obtaining informed consent, the scope was                            passed under direct vision. Throughout the                            procedure, the patient's blood pressure, pulse, and                            oxygen saturations were monitored continuously. The                            [id number]-Q190V ([id number]) Olympus duodenoscope was                            introduced through the mouth, and used to inject                            contrast into and used to inject contrast into the                            bile duct. The ERCP was accomplished without                            difficulty. The patient tolerated the procedure                            well. Scope In: Scope Out: Findings:      The scout film was normal. The esophagus was successfully intubated       under direct vision. The scope was advanced to major papilla in the       descending duodenum without detailed examination of the pharynx, larynx       and associated structures, and upper GI tract. The upper GI tract was       grossly normal.      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 The bile duct was deeply       cannulated using wire-guided approach on first attempt. Thereafter deep       cannulation was performed using short-nosed traction sphincterotome over  the wire (0.035). Contrast was injected. Ductal flow of contrast was       adequate. Image quality was adequate. Contrast extended to the entire       biliary tree. 8 mm filling defect consistent with choledocholithiasis       was visualized in the mid CBD. The entire biliary system was dilated       with CBD measuring 14 mm. The right and left hepatic ducts were mildly       dilated. The cystic duct and gallbladder did fill. Multiple stones were       noted in the gallbladder consistent with cholelithiasis.      A 6 mm biliary sphincterotomy was made with a monofilament traction       (standard) sphincterotome using ERBE electrocautery on endocut  mode at       12 o'clock position. There was no post-sphincterotomy bleeding. Dilation       of the common bile duct with a 10-13-10 mm balloon (to a maximum balloon       size of 11 mm) dilator was successful. The biliary tree was swept with a       12 mm balloon starting at the bifurcation with resultant removal of 8 mm       yellowish stone. The balloon was trolled several times in and out of the       common bile duct. All stones were removed. Bile was green. No pus. The       postocclusion cholangiogram did not reveal any residual stones.      PD was intentionally not cannulated or injected. Indocin suppositories       were given prior to scope insertion. Impression:               - Choledocholithiasis s/p biliary                            sphincterotomy/sphincteroplasty and stone                            extraction.                           - Cholelithiasis.                           - Periampullary diverticula. Recommendation:           - Return patient to hospital ward for ongoing care.                           - Clear liquid diet.                           - Do recommend lap chole.                           - Watch for pancreatitis, bleeding, perforation,                            and cholangitis.                           - Observe for any delayed  contrast allergic                            reactions.                           - The findings and recommendations were discussed                            with the patient. Procedure Code(s):        --- Professional ---                           [id number], 59, Endoscopic retrograde                            cholangiopancreatography (ERCP); with                            trans-endoscopic balloon dilation of                            biliary/pancreatic duct(s) or of ampulla                            (sphincteroplasty), including sphincterotomy, when                            performed, each duct                            [id number], Endoscopic retrograde                            cholangiopancreatography (ERCP); with removal of                            calculi/debris from biliary/pancreatic duct(s)                           [id number], Endoscopic catheterization of the biliary                            ductal system, radiological supervision and                            interpretation Diagnosis Code(s):        --- Professional ---                           K80.50, Calculus of bile duct without cholangitis                            or cholecystitis without obstruction CPT copyright 2022 [employer]. All rights reserved. The codes documented in this report are preliminary and upon coder review may  be revised to meet current compliance requirements. Lynann Bolognaajesh Aijah Lattner, MD 04/10/2022 2:36:47 PM This report has been signed electronically. Number of Addenda: 0

## 2022-04-10 NOTE — Transfer of Care (Signed)
Immediate Anesthesia Transfer of Care Note  Patient: Angela Monroe  Procedure(s) Performed: ENDOSCOPIC RETROGRADE CHOLANGIOPANCREATOGRAPHY (ERCP) SPHINCTEROTOMY REMOVAL OF STONES BILIARY DILATION  Patient Location: PACU  Anesthesia Type:General  Level of Consciousness: awake and alert   Airway & Oxygen Therapy: Patient Spontanous Breathing and Patient connected to nasal cannula oxygen  Post-op Assessment: Report given to RN and Post -op Vital signs reviewed and stable  Post vital signs: Reviewed and stable  Last Vitals:  Vitals Value Taken Time  BP    Temp    Pulse    Resp    SpO2      Last Pain:  Vitals:   04/10/22 1147  TempSrc: Temporal  PainSc: 0-No pain      Patients Stated Pain Goal: 0 (04/09/22 0011)  Complications: No notable events documented.

## 2022-04-10 NOTE — Interval H&P Note (Signed)
History and Physical Interval Note:  04/10/2022 1:29 PM  Angela Monroe  has presented today for surgery, with the diagnosis of Dilated CBD, elevated LFTS.  The various methods of treatment have been discussed with the patient and family. After consideration of risks, benefits and other options for treatment, the patient has consented to  Procedure(s): ENDOSCOPIC RETROGRADE CHOLANGIOPANCREATOGRAPHY (ERCP) (N/A) as a surgical intervention.  The patient's history has been reviewed, patient examined, no change in status, stable for surgery.  I have reviewed the patient's chart and labs.  Questions were answered to the patient's satisfaction.     Lynann Bologna

## 2022-04-11 ENCOUNTER — Inpatient Hospital Stay (HOSPITAL_COMMUNITY): Payer: No Typology Code available for payment source

## 2022-04-11 ENCOUNTER — Other Ambulatory Visit: Payer: Self-pay

## 2022-04-11 ENCOUNTER — Encounter (HOSPITAL_COMMUNITY): Payer: Self-pay | Admitting: Family Medicine

## 2022-04-11 ENCOUNTER — Encounter (HOSPITAL_COMMUNITY)
Admission: EM | Disposition: A | Discharge status: Home / Self Care | Payer: Self-pay | Source: Home / Self Care | Attending: Internal Medicine

## 2022-04-11 DIAGNOSIS — K819 Cholecystitis, unspecified: Secondary | ICD-10-CM

## 2022-04-11 DIAGNOSIS — G35 Multiple sclerosis: Secondary | ICD-10-CM | POA: Diagnosis not present

## 2022-04-11 DIAGNOSIS — I10 Essential (primary) hypertension: Secondary | ICD-10-CM | POA: Diagnosis not present

## 2022-04-11 DIAGNOSIS — K8050 Calculus of bile duct without cholangitis or cholecystitis without obstruction: Secondary | ICD-10-CM | POA: Diagnosis not present

## 2022-04-11 HISTORY — PX: CHOLECYSTECTOMY: SHX55

## 2022-04-11 LAB — CBC
HCT: 41 % (ref 36.0–46.0)
Hemoglobin: 14.3 g/dL (ref 12.0–15.0)
MCH: 30.2 pg (ref 26.0–34.0)
MCHC: 34.9 g/dL (ref 30.0–36.0)
MCV: 86.5 fL (ref 80.0–100.0)
Platelets: 218 10*3/uL (ref 150–400)
RBC: 4.74 MIL/uL (ref 3.87–5.11)
RDW: 13.1 % (ref 11.5–15.5)
WBC: 6 10*3/uL (ref 4.0–10.5)
nRBC: 0 % (ref 0.0–0.2)

## 2022-04-11 LAB — COMPREHENSIVE METABOLIC PANEL
ALT: 161 U/L — ABNORMAL HIGH (ref 0–44)
AST: 68 U/L — ABNORMAL HIGH (ref 15–41)
Albumin: 3.5 g/dL (ref 3.5–5.0)
Alkaline Phosphatase: 80 U/L (ref 38–126)
Anion gap: 13 (ref 5–15)
BUN: 7 mg/dL — ABNORMAL LOW (ref 8–23)
CO2: 23 mmol/L (ref 22–32)
Calcium: 9.4 mg/dL (ref 8.9–10.3)
Chloride: 102 mmol/L (ref 98–111)
Creatinine, Ser: 0.87 mg/dL (ref 0.44–1.00)
GFR, Estimated: >60 mL/min (ref >60)
Glucose, Bld: 100 mg/dL — ABNORMAL HIGH (ref 70–99)
Potassium: 3.6 mmol/L (ref 3.5–5.1)
Sodium: 138 mmol/L (ref 135–145)
Total Bilirubin: 1.6 mg/dL — ABNORMAL HIGH (ref 0.3–1.2)
Total Protein: 6.8 g/dL (ref 6.5–8.1)

## 2022-04-11 SURGERY — LAPAROSCOPIC CHOLECYSTECTOMY
Anesthesia: General | Site: Abdomen

## 2022-04-11 MED ORDER — PROPOFOL 10 MG/ML IV BOLUS
INTRAVENOUS | Status: DC | PRN
  Administered 2022-04-11: 150 mg via INTRAVENOUS

## 2022-04-11 MED ORDER — FENTANYL CITRATE (PF) 250 MCG/5ML IJ SOLN
INTRAMUSCULAR | Status: AC
  Filled 2022-04-11: qty 5

## 2022-04-11 MED ORDER — CHLORHEXIDINE GLUCONATE 0.12 % MT SOLN
OROMUCOSAL | Status: AC
  Administered 2022-04-11: 15 mL via OROMUCOSAL
  Filled 2022-04-11: qty 15

## 2022-04-11 MED ORDER — BUPIVACAINE HCL (PF) 0.25 % IJ SOLN
INTRAMUSCULAR | Status: AC
  Filled 2022-04-11: qty 30

## 2022-04-11 MED ORDER — DEXAMETHASONE SODIUM PHOSPHATE 10 MG/ML IJ SOLN
INTRAMUSCULAR | Status: DC | PRN
  Administered 2022-04-11: 10 mg via INTRAVENOUS

## 2022-04-11 MED ORDER — SODIUM CHLORIDE 0.9 % IR SOLN
Status: DC | PRN
  Administered 2022-04-11: 1000 mL

## 2022-04-11 MED ORDER — CHLORHEXIDINE GLUCONATE 0.12 % MT SOLN
15.0000 mL | Freq: Once | OROMUCOSAL | Status: AC
  Filled 2022-04-11: qty 15

## 2022-04-11 MED ORDER — CEFAZOLIN SODIUM-DEXTROSE 2-3 GM-%(50ML) IV SOLR
INTRAVENOUS | Status: DC | PRN
  Administered 2022-04-11: 2 g via INTRAVENOUS

## 2022-04-11 MED ORDER — PHENYLEPHRINE 80 MCG/ML (10ML) SYRINGE FOR IV PUSH (FOR BLOOD PRESSURE SUPPORT)
PREFILLED_SYRINGE | INTRAVENOUS | Status: DC | PRN
  Administered 2022-04-11: 80 ug via INTRAVENOUS

## 2022-04-11 MED ORDER — ONDANSETRON HCL 4 MG/2ML IJ SOLN
INTRAMUSCULAR | Status: DC | PRN
  Administered 2022-04-11: 4 mg via INTRAVENOUS

## 2022-04-11 MED ORDER — CEFAZOLIN SODIUM 1 G IJ SOLR
INTRAMUSCULAR | Status: AC
  Filled 2022-04-11: qty 20

## 2022-04-11 MED ORDER — FENTANYL CITRATE (PF) 100 MCG/2ML IJ SOLN
INTRAMUSCULAR | Status: AC
  Filled 2022-04-11: qty 2

## 2022-04-11 MED ORDER — LIDOCAINE 2% (20 MG/ML) 5 ML SYRINGE
INTRAMUSCULAR | Status: DC | PRN
  Administered 2022-04-11: 100 mg via INTRAVENOUS

## 2022-04-11 MED ORDER — 0.9 % SODIUM CHLORIDE (POUR BTL) OPTIME
TOPICAL | Status: DC | PRN
  Administered 2022-04-11: 1000 mL

## 2022-04-11 MED ORDER — ACETAMINOPHEN 500 MG PO TABS
1000.0000 mg | ORAL_TABLET | Freq: Once | ORAL | Status: AC
  Administered 2022-04-11: 1000 mg via ORAL
  Filled 2022-04-11: qty 2

## 2022-04-11 MED ORDER — ONDANSETRON HCL 4 MG/2ML IJ SOLN: 4.0000 mg | Freq: Four times a day (QID) | INTRAMUSCULAR | Status: DC | PRN

## 2022-04-11 MED ORDER — ORAL CARE MOUTH RINSE: 15.0000 mL | Freq: Once | OROMUCOSAL | Status: AC

## 2022-04-11 MED ORDER — MIDAZOLAM HCL 2 MG/2ML IJ SOLN
INTRAMUSCULAR | Status: AC
  Filled 2022-04-11: qty 2

## 2022-04-11 MED ORDER — SUGAMMADEX SODIUM 200 MG/2ML IV SOLN
INTRAVENOUS | Status: DC | PRN
  Administered 2022-04-11: 200 mg via INTRAVENOUS

## 2022-04-11 MED ORDER — FENTANYL CITRATE (PF) 100 MCG/2ML IJ SOLN
25.0000 ug | INTRAMUSCULAR | Status: DC | PRN
  Administered 2022-04-11: 50 ug via INTRAVENOUS

## 2022-04-11 MED ORDER — BUPIVACAINE HCL 0.25 % IJ SOLN
INTRAMUSCULAR | Status: DC | PRN
  Administered 2022-04-11: 30 mL

## 2022-04-11 MED ORDER — LACTATED RINGERS IV SOLN: INTRAVENOUS | Status: DC

## 2022-04-11 MED ORDER — OXYCODONE HCL 5 MG PO TABS: 5.0000 mg | ORAL_TABLET | ORAL | 0 refills | Status: AC | PRN

## 2022-04-11 MED ORDER — OXYCODONE HCL 5 MG/5ML PO SOLN: 5.0000 mg | Freq: Once | ORAL | Status: DC | PRN

## 2022-04-11 MED ORDER — PROPOFOL 10 MG/ML IV BOLUS
INTRAVENOUS | Status: AC
  Filled 2022-04-11: qty 20

## 2022-04-11 MED ORDER — MIDAZOLAM HCL 2 MG/2ML IJ SOLN
INTRAMUSCULAR | Status: DC | PRN
  Administered 2022-04-11: 2 mg via INTRAVENOUS

## 2022-04-11 MED ORDER — FENTANYL CITRATE (PF) 250 MCG/5ML IJ SOLN
INTRAMUSCULAR | Status: DC | PRN
  Administered 2022-04-11 (×2): 50 ug via INTRAVENOUS

## 2022-04-11 MED ORDER — OXYCODONE HCL 5 MG PO TABS: 5.0000 mg | ORAL_TABLET | Freq: Once | ORAL | Status: DC | PRN

## 2022-04-11 MED ORDER — ROCURONIUM BROMIDE 10 MG/ML (PF) SYRINGE
PREFILLED_SYRINGE | INTRAVENOUS | Status: DC | PRN
  Administered 2022-04-11: 60 mg via INTRAVENOUS

## 2022-04-11 MED ORDER — DROPERIDOL 2.5 MG/ML IJ SOLN
INTRAMUSCULAR | Status: DC | PRN
  Administered 2022-04-11: .625 mg via INTRAVENOUS

## 2022-04-11 SURGICAL SUPPLY — 48 items
ADH SKN CLS APL DERMABOND .7 (GAUZE/BANDAGES/DRESSINGS) ×1
APL PRP STRL LF DISP 70% ISPRP (MISCELLANEOUS) ×1
APPLIER CLIP 5 13 M/L LIGAMAX5 (MISCELLANEOUS) ×1
APR CLP MED LRG 5 ANG JAW (MISCELLANEOUS) ×1
BAG COUNTER SPONGE SURGICOUNT (BAG) ×2 IMPLANT
BAG SPEC RTRVL 10 TROC 200 (ENDOMECHANICALS) ×1
BAG SPNG CNTER NS LX DISP (BAG) ×1
CANISTER SUCT 3000ML PPV (MISCELLANEOUS) ×2 IMPLANT
CHLORAPREP W/TINT 26 (MISCELLANEOUS) ×2 IMPLANT
CLIP APPLIE 5 13 M/L LIGAMAX5 (MISCELLANEOUS) ×2 IMPLANT
COVER SURGICAL LIGHT HANDLE (MISCELLANEOUS) ×2 IMPLANT
DERMABOND ADVANCED .7 DNX12 (GAUZE/BANDAGES/DRESSINGS) ×2 IMPLANT
ELECT REM PT RETURN 9FT ADLT (ELECTROSURGICAL) ×1
ELECTRODE REM PT RTRN 9FT ADLT (ELECTROSURGICAL) ×2 IMPLANT
ENDOLOOP SUT PDS II  0 18 (SUTURE) ×1
ENDOLOOP SUT PDS II 0 18 (SUTURE) IMPLANT
GLOVE BIO SURGEON STRL SZ7.5 (GLOVE) ×2 IMPLANT
GLOVE BIOGEL PI IND STRL 8 (GLOVE) ×2 IMPLANT
GOWN STRL REUS W/ TWL LRG LVL3 (GOWN DISPOSABLE) ×4 IMPLANT
GOWN STRL REUS W/ TWL XL LVL3 (GOWN DISPOSABLE) ×2 IMPLANT
GOWN STRL REUS W/TWL LRG LVL3 (GOWN DISPOSABLE) ×2
GOWN STRL REUS W/TWL XL LVL3 (GOWN DISPOSABLE) ×1
GRASPER SUT TROCAR 14GX15 (MISCELLANEOUS) ×2 IMPLANT
IRRIG SUCT STRYKERFLOW 2 WTIP (MISCELLANEOUS) ×1
IRRIGATION SUCT STRKRFLW 2 WTP (MISCELLANEOUS) ×2 IMPLANT
KIT BASIN OR (CUSTOM PROCEDURE TRAY) ×2 IMPLANT
KIT TURNOVER KIT B (KITS) ×2 IMPLANT
NDL 22X1.5 STRL (OR ONLY) (MISCELLANEOUS) ×2 IMPLANT
NDL INSUFFLATION 14GA 120MM (NEEDLE) ×2 IMPLANT
NEEDLE 22X1.5 STRL (OR ONLY) (MISCELLANEOUS) ×1 IMPLANT
NEEDLE INSUFFLATION 14GA 120MM (NEEDLE) ×1 IMPLANT
NS IRRIG 1000ML POUR BTL (IV SOLUTION) ×2 IMPLANT
PAD ARMBOARD 7.5X6 YLW CONV (MISCELLANEOUS) ×2 IMPLANT
POUCH RETRIEVAL ECOSAC 10 (ENDOMECHANICALS) ×2 IMPLANT
POUCH RETRIEVAL ECOSAC 10MM (ENDOMECHANICALS) ×1
SCISSORS LAP 5X35 DISP (ENDOMECHANICALS) ×2 IMPLANT
SET TUBE SMOKE EVAC HIGH FLOW (TUBING) ×2 IMPLANT
SLEEVE Z-THREAD 5X100MM (TROCAR) ×4 IMPLANT
SPECIMEN JAR SMALL (MISCELLANEOUS) ×2 IMPLANT
SUT MNCRL AB 4-0 PS2 18 (SUTURE) ×2 IMPLANT
TOWEL GREEN STERILE (TOWEL DISPOSABLE) ×2 IMPLANT
TOWEL GREEN STERILE FF (TOWEL DISPOSABLE) ×2 IMPLANT
TRAY LAPAROSCOPIC MC (CUSTOM PROCEDURE TRAY) ×2 IMPLANT
TROCAR 11X100 Z THREAD (TROCAR) ×2 IMPLANT
TROCAR XCEL NON-BLD 5MMX100MML (ENDOMECHANICALS) ×2 IMPLANT
TROCAR Z-THREAD OPTICAL 5X100M (TROCAR) IMPLANT
WARMER LAPAROSCOPE (MISCELLANEOUS) ×2 IMPLANT
WATER STERILE IRR 1000ML POUR (IV SOLUTION) ×2 IMPLANT

## 2022-04-11 NOTE — Discharge Summary (Signed)
Physician Discharge Summary  Angela Monroe OZH:[id number] DOB: 03/18/1960 DOA: 04/08/2022  PCP: Quita Skye, PA-C  Admit date: 04/08/2022 Discharge date: 04/11/2022  Admitted From: Home  Discharge disposition: Home   Recommendations for Outpatient Follow-Up:   Follow-up with general surgery as has been scheduled for postoperative follow-up.   Follow up with your primary care provider in 2 to 3 weeks Check CBC, BMP, magnesium in the next visit   Discharge Diagnosis:   Principal Problem:   Choledocholithiasis Active Problems:   Multiple sclerosis   Essential hypertension   Obstructive jaundice   Discharge Condition: Improved.  Diet recommendation: Low sodium, heart healthy.    Wound care: None.  Code status: Full.   History of Present Illness:   Angela Monroe is a 62 y.o. female with medical history significant of  multiple sclerosis on Avonex, hypertension presented to hospital with abdominal pain for 4 days prior to presentation with nausea and 1 episode of vomiting.  Abdominal pain  was acute in onset after her dinner.  In the ED, initial labs on initial presentation on 04/08/2022 showed total bilirubin elevated at 1.7 with AST of 72 WBC was elevated at 13.3.  Urinalysis showed some ketones.  EKG showed normal sinus rhythm.  Ultrasound of the right upper quadrant showed cholelithiasis without cholecystitis but dilated common bile duct.  Chest x-ray was negative for acute findings.  Patient was then considered for admission to hospital but then decided to leave AGAINST MEDICAL ADVICE but subsequently returned to the hospital for further evaluation and treatment. During hospitalization, patient underwent MRCP followed by ERCP on 04/10/2022 with findings of choledocholithiasis.  Patient underwent biliary sphincterectomy and sphincteroplasty with stone extraction and was noted to have cholelithiasis as well.  Patient then underwent laparoscopic cholecystectomy 04/11/22.     Hospital Course:   Following conditions were addressed during hospitalization as listed below,  Acute choledocholithiasis.   Right upper quadrant pain, nausea vomiting.  History of gallstones elevated LFT.   Patient was initially treated conservatively with IV fluids and analgesics.  Had MRCP and ERCP subsequently with extraction of the CBD stone.   LFTs have trended down.  General surgery has seen the patient and patient has undergone laparoscopic cholecystectomy 04/11/22.  Neurosurgery okay about disposition home after surgery.   History of hypertension On lisinopril hydrochlorothiazide.  Overall blood pressure stable.  Will resume on discharge.   History of multiple sclerosis on Avenox at home.  Was on hold during hospitalization due to, concern for infection, surgery.  Will restart after discharge.  Disposition.  At this time, patient is stable for disposition home with outpatient PCP and general surgery follow-up.  Medical Consultants:   General surgery GI  Procedures:   MRCP ERCP with biliary sphincterectomy and sphincteroplasty with stone extraction on 04/10/2022 Laparoscopic cholecystectomy 04/11/22 Subjective:   Today, patient seen and examined at bedside before cholecystectomy.  Had some nausea. Discharge Exam:   Vitals:   04/11/22 1645 04/11/22 1700  BP: (!) 162/77 (!) 166/80  Pulse: 63 (!) 56  Resp: 17 17  Temp:    SpO2: 99% 98%   Vitals:   04/11/22 1024 04/11/22 1630 04/11/22 1645 04/11/22 1700  BP: (!) 142/76 (!) 156/87 (!) 162/77 (!) 166/80  Pulse:  79 63 (!) 56  Resp: Temp: 98.1 F (36.7 C) 97.9 F (36.6 C)    TempSrc: Oral     SpO2:  97% 99% 98%  Weight: 70.3 kg  Height: 5\' 5"  (1.651 m)       General: Alert awake, not in obvious distress HENT: pupils equally reacting to light,   Oral mucosa is moist.  Chest:  Clear breath sounds.  Diminished breath sounds bilaterally. No crackles or wheezes.  CVS: S1 &S2 heard. No murmur.  Regular  rate and rhythm. Abdomen: Soft, nontender, nondistended.  Bowel sounds are heard.   Extremities: No cyanosis, clubbing or edema.  Peripheral pulses are palpable. Psych: Alert, awake and oriented, normal mood CNS:  No cranial nerve deficits.  Power equal in all extremities.   Skin: Warm and dry.  No rashes noted.  The results of significant diagnostics from this hospitalization (including imaging, microbiology, ancillary and laboratory) are listed below for reference.     Diagnostic Studies:   MR ABDOMEN MRCP WO CONTRAST  Result Date: 04/09/2022 CLINICAL DATA:  62 year old female with right upper quadrant abdominal pain. EXAM: MRI ABDOMEN WITHOUT CONTRAST  (INCLUDING MRCP) TECHNIQUE: Multiplanar multisequence MR imaging of the abdomen was performed. Heavily T2-weighted images of the biliary and pancreatic ducts were obtained, and three-dimensional MRCP images were rendered by post processing. COMPARISON:  No prior abdominal MRI. Abdominal ultrasound 04/08/2022. FINDINGS: Comment: Today's study is limited for detection and characterization of visceral and/or vascular lesions by lack of IV gadolinium. Lower chest: Unremarkable. Hepatobiliary: Out of phase imaging demonstrates diffuse loss of signal intensity throughout the hepatic parenchyma, indicative of a background of hepatic steatosis. No discrete cystic or solid hepatic lesions are confidently identified on today's noncontrast examination. MRCP images demonstrate mild intra and extrahepatic biliary ductal dilatation with the common bile duct measuring up to 10 mm in the porta hepatis. In the distal common bile duct (coronal MRCP image 95 of series 9) there is a 6 mm filling defect, compatible with choledocholithiasis. Multiple small filling defects are also noted lying dependently in the gallbladder measuring up to 13 mm in diameter. Gallbladder is moderately distended. Gallbladder wall appears mildly edematous with trace volume of pericholecystic  fluid concerning for early acute cholecystitis. Pancreas: No definite pancreatic mass identified on today's noncontrast examination. No pancreatic ductal dilatation. No peripancreatic fluid collections or inflammatory changes. Spleen:  Unremarkable. Adrenals/Urinary Tract: Bilateral kidneys and adrenal glands are normal in appearance on today's noncontrast examination. No hydroureteronephrosis in the visualized portions of the abdomen. Stomach/Bowel: Numerous colonic diverticula are noted. Visualized portions are otherwise unremarkable. Vascular/Lymphatic: No aneurysm identified in the visualized abdominal vasculature. No lymphadenopathy noted in the abdomen. Other: No significant volume of ascites noted in the visualized portions of the peritoneal cavity. Musculoskeletal: No aggressive appearing osseous lesions are noted in the visualized portions of the skeleton. IMPRESSION: 1. Cholelithiasis with imaging findings concerning for early acute cholecystitis. Notably, however, there was no sonographic Murphy's sign on the recent ultrasound examination. Further clinical evaluation is suggested. 2. Study is positive for choledocholithiasis with a 6 mm stone in the distal common bile duct with mild proximal intra and extrahepatic biliary ductal dilatation indicating mild obstruction. 3. Colonic diverticulosis. 4. Hepatic steatosis. Electronically Signed   By: Trudie Reedaniel  Entrikin M.D.   On: 04/09/2022 11:42   US Abdomen Limited RUQ (LIVER/GB)  Result Date: 04/08/2022 CLINICAL DATA:  Right upper quadrant pain. EXAM: ULTRASOUND ABDOMEN LIMITED RIGHT UPPER QUADRANT COMPARISON:  None Available. FINDINGS: Gallbladder: A 1.5 cm shadowing echogenic gallstone is seen within the dependent portion of the gallbladder lumen. The gallbladder wall measures 2.9 mm in thickness. No sonographic Murphy sign noted by sonographer. Common bile duct: Diameter: 12.0 mm  Liver: No focal lesion identified. Diffusely increased echogenicity of the  liver parenchyma is noted. Portal vein is patent on color Doppler imaging with normal direction of blood flow towards the liver. Other: None. IMPRESSION: 1. Cholelithiasis, without evidence of acute cholecystitis. 2. Dilated common bile duct without visualized obstructing lesion. Further evaluation with MRCP is recommended. 3. 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 Electronically Signed   By: Aram Candela M.D.   On: 04/08/2022 02:23   DG Chest 2 View  Result Date: 04/08/2022 CLINICAL DATA:  Chest tightness, upper abdominal pain EXAM: CHEST - 2 VIEW COMPARISON:  None Available. FINDINGS: Cardiac contours at the upper limit of normal. No focal pulmonary opacity. No pleural effusion or pneumothorax. No acute osseous abnormality. IMPRESSION: No acute cardiopulmonary process. Electronically Signed   By: Wiliam Ke M.D.   On: 04/08/2022 01:16     Labs:   Basic Metabolic Panel: Recent Labs  Lab 04/08/22 0052 04/08/22 0846 04/09/22 0433 04/10/22 0440 04/11/22 0528  NA 138 139 140 138 138  K 3.5 4.0 3.9 3.9 3.6  CL 106 104 108 105 102  CO2 23 25 24 23 23   GLUCOSE 137* 122* 101* 99 100*  BUN 15 11 8  7* 7*  CREATININE 0.83 0.94 1.01* 0.88 0.87  CALCIUM 9.5 9.6 9.0 9.2 9.4  MG  --  2.0  --  2.2  --    GFR Estimated Creatinine Clearance: 65.9 mL/min (by C-G formula based on SCr of 0.87 mg/dL). Liver Function Tests: Recent Labs  Lab 04/08/22 0052 04/08/22 0846 04/09/22 0433 04/10/22 0440 04/11/22 0528  AST 72* 280* 221* 148* 68*  ALT 41 187* 270* 241* 161*  ALKPHOS 48 56 57 83 80  BILITOT 1.7* 3.5* 5.4* 2.9* 1.6*  PROT 7.2 7.1 6.7 7.1 6.8  ALBUMIN 4.0 3.8 3.4* 3.7 3.5   Recent Labs  Lab 04/08/22 0052  LIPASE 39   No results for input(s): "AMMONIA" in the last 168 hours. Coagulation profile No results for input(s): "INR", "PROTIME" in the last 168 hours.  CBC: Recent Labs  Lab 04/08/22 0052 04/08/22 0846 04/09/22 0433 04/10/22 0440 04/11/22 0528  WBC 13.3*  10.8* 6.1 5.6 6.0  NEUTROABS 11.1* 8.9*  --   --   --   HGB 14.9 14.5 14.0 14.5 14.3  HCT 42.5 43.2 40.1 44.0 41.0  MCV 86.6 87.8 87.7 89.2 86.5  PLT 212 198 182 215 218   Cardiac Enzymes: No results for input(s): "CKTOTAL", "CKMB", "CKMBINDEX", "TROPONINI" in the last 168 hours. BNP: Invalid input(s): "POCBNP" CBG: No results for input(s): "GLUCAP" in the last 168 hours. D-Dimer No results for input(s): "DDIMER" in the last 72 hours. Hgb A1c No results for input(s): "HGBA1C" in the last 72 hours. Lipid Profile No results for input(s): "CHOL", "HDL", "LDLCALC", "TRIG", "CHOLHDL", "LDLDIRECT" in the last 72 hours. Thyroid function studies No results for input(s): "TSH", "T4TOTAL", "T3FREE", "THYROIDAB" in the last 72 hours.  Invalid input(s): "FREET3" Anemia work up No results for input(s): "VITAMINB12", "FOLATE", "FERRITIN", "TIBC", "IRON", "RETICCTPCT" in the last 72 hours. Microbiology No results found for this or any previous visit (from the past 240 hour(s)).   Discharge Instructions:   Discharge Instructions     Call MD for:  persistant nausea and vomiting   Complete by: As directed    Call MD for:  severe uncontrolled pain   Complete by: As directed    Call MD for:  temperature >100.4   Complete by: As directed  Diet - low sodium heart healthy   Complete by: As directed    Discharge instructions   Complete by: As directed    Follow-up with your primary care provider in 1 to 2 weeks.  Check blood work at that time.  Follow-up with general surgery as scheduled by the clinic for post surgical follow-up.  Seek medical attention for worsening symptoms.   Increase activity slowly   Complete by: As directed       Allergies as of 04/11/2022       Reactions   Contrast Media [iodinated Contrast Media]    Per pt-- "dry mouth, affect and my salivary glands, I don't like it again" 04/10/22 pt declined steroids and tolerated contrast with ERCP   Copaxone [glatiramer  Acetate] Hives   Specific to this formulation. Now on Glatopa without hives.        Medication List     TAKE these medications    Avonex Prefilled 30 MCG/0.5ML Pskt injection Generic drug: interferon beta-1a Inject 30 mcg into the muscle once a week.   Cholecalciferol 50 MCG (2000 UT) Caps Take 4,000 Units by mouth daily.   lisinopril-hydrochlorothiazide 20-12.5 MG tablet Commonly known as: ZESTORETIC Take 0.5 tablets by mouth daily.   multivitamin tablet Take 1 tablet by mouth daily.   oxyCODONE 5 MG immediate release tablet Commonly known as: Oxy IR/ROXICODONE Take 1 tablet (5 mg total) by mouth every 4 (four) hours as needed for severe pain.        Follow-up Information     [facility], Hedda Slade, [place]. Go on 05/04/2022.   Specialty: General Surgery Why: 2:45 PM for post-op follow up. Please arrive 30 minutes prior to appointment time. Contact information: [address] [facility] [place] [address] [phone]         Quita Skye, PA-C Follow up in 2 week(s).   Specialty: Physician Assistant Contact information: [address] [place] [address] [phone]                  Time coordinating discharge: 39 minutes  Signed:  Shaleen Talamantez  [facility] 04/11/2022, 5:12 PM

## 2022-04-11 NOTE — Discharge Instructions (Signed)
[facility]  Please arrive at least 30 min before your appointment to complete your check in paperwork.  If you are unable to arrive 30 min prior to your appointment time we may have to cancel or reschedule you. LAPAROSCOPIC SURGERY: POST OP INSTRUCTIONS Always review your discharge instruction sheet given to you by the facility where your surgery was performed. IF YOU HAVE DISABILITY OR FAMILY LEAVE FORMS, YOU MUST BRING THEM TO THE OFFICE FOR PROCESSING.   DO NOT GIVE THEM TO YOUR DOCTOR.  PAIN CONTROL  First take acetaminophen (Tylenol) AND/or ibuprofen (Advil) to control your pain after surgery.  Follow directions on package.  Taking acetaminophen (Tylenol) and/or ibuprofen (Advil) regularly after surgery will help to control your pain and lower the amount of prescription pain medication you may need.  You should not take more than 4,000 mg (4 grams) of acetaminophen (Tylenol) in 24 hours.  You should not take ibuprofen (Advil), aleve, motrin, naprosyn or other NSAIDS if you have a history of stomach ulcers or chronic kidney disease.  A prescription for pain medication may be given to you upon discharge.  Take your pain medication as prescribed, if you still have uncontrolled pain after taking acetaminophen (Tylenol) or ibuprofen (Advil). Use ice packs to help control pain. If you need a refill on your pain medication, please contact your pharmacy.  They will contact our office to request authorization. Prescriptions will not be filled after 5pm or on week-ends.  HOME MEDICATIONS Take your usually prescribed medications unless otherwise directed.  DIET You should follow a light diet the first few days after arrival home.  Be sure to include lots of fluids daily. Avoid fatty, fried foods.   CONSTIPATION It is common to experience some constipation after surgery and if you are taking pain medication.  Increasing fluid intake and taking a stool softener (such as Colace)  will usually help or prevent this problem from occurring.  A mild laxative (Milk of Magnesia or Miralax) should be taken according to package instructions if there are no bowel movements after 48 hours.  WOUND/INCISION CARE Most patients will experience some swelling and bruising in the area of the incisions.  Ice packs will help.  Swelling and bruising can take several days to resolve.  Unless discharge instructions indicate otherwise, follow guidelines below  STERI-STRIPS - you may remove your outer bandages 48 hours after surgery, and you may shower at that time.  You have steri-strips (small skin tapes) in place directly over the incision.  These strips should be left on the skin for 7-10 days.   DERMABOND/SKIN GLUE - you may shower in 24 hours.  The glue will flake off over the next 2-3 weeks. Any sutures or staples will be removed at the office during your follow-up visit.  ACTIVITIES You may resume regular (light) daily activities beginning the next day--such as daily self-care, walking, climbing stairs--gradually increasing activities as tolerated.  You may have sexual intercourse when it is comfortable.  Refrain from any heavy lifting or straining until approved by your doctor. You may drive when you are no longer taking prescription pain medication, you can comfortably wear a seatbelt, and you can safely maneuver your car and apply brakes.  FOLLOW-UP You should see your doctor in the office for a follow-up appointment approximately 2-3 weeks after your surgery.  You should have been given your post-op/follow-up appointment when your surgery was scheduled.  If you did not receive a post-op/follow-up appointment, make sure   that you call for this appointment within a day or two after you arrive home to insure a convenient appointment time.   WHEN TO CALL YOUR DOCTOR: Fever over 101.0 Inability to urinate Continued bleeding from incision. Increased pain, redness, or drainage from the  incision. Increasing abdominal pain  The clinic staff is available to answer your questions during regular business hours.  Please don't hesitate to call and ask to speak to one of the nurses for clinical concerns.  If you have a medical emergency, go to the nearest emergency room or call 911.  A surgeon from [facility] is always on call at the hospital. [address], [address], [place], [place]  [address] ? [address] [address], [place], [place]   [address] [phone] ? [phone] ? FAX [phone]  

## 2022-04-11 NOTE — Discharge Summary (Addendum)
Patient was admitted by me this morning but then decided to go home AGAINST MEDICAL ADVICE due to financial issues to return on the same day so please refer to the H&P done today by me and the subsequent provider on the same day.

## 2022-04-11 NOTE — Anesthesia Postprocedure Evaluation (Signed)
Anesthesia Post Note  Patient: Angela Monroe  Procedure(s) Performed: ENDOSCOPIC RETROGRADE CHOLANGIOPANCREATOGRAPHY (ERCP) SPHINCTEROTOMY REMOVAL OF STONES BILIARY DILATION     Patient location during evaluation: PACU Anesthesia Type: General Level of consciousness: awake and alert Pain management: pain level controlled Vital Signs Assessment: post-procedure vital signs reviewed and stable Respiratory status: spontaneous breathing, nonlabored ventilation and respiratory function stable Cardiovascular status: blood pressure returned to baseline and stable Postop Assessment: no apparent nausea or vomiting Anesthetic complications: no   No notable events documented.  Last Vitals:  Vitals:   04/10/22 1923 04/11/22 0403  BP: (!) 156/82 118/68  Pulse: (!) 54 (!) 59  Resp: 18 18  Temp: 36.9 C 36.8 C  SpO2: 99% 97%    Last Pain:  Vitals:   04/11/22 0715  TempSrc:   PainSc: 0-No pain   Pain Goal: Patients Stated Pain Goal: 0 (04/09/22 0011)                 Lowella Curb

## 2022-04-11 NOTE — Plan of Care (Signed)

## 2022-04-11 NOTE — Progress Notes (Addendum)
PROGRESS NOTE    Angela Monroe  QRF:[id number] DOB: 06-08-1960 DOA: 04/08/2022 PCP: Quita Skye, PA-C    Brief Narrative:  Angela Monroe is a 62 y.o. female with medical history significant of  multiple sclerosis on Avonex, hypertension presented to hospital with abdominal pain for 4 days prior to presentation with nausea and 1 episode of vomiting.  Abdominal pain which was acute in onset after her dinner.  In the ED, initial labs on initial presentation on 04/08/2022 showed total bilirubin elevated at 1.7 with AST of 72 WBC was elevated at 13.3.  Urinalysis showed some ketones.  EKG showed normal sinus rhythm.  Ultrasound of the right upper quadrant showed cholelithiasis without cholecystitis but dilated common bile duct.  Chest x-ray was negative for acute findings.  Patient was then considered for admission to hospital but then decided to leave AGAINST MEDICAL ADVICE but subsequently returned to the hospital for further evaluation and treatment.  During hospitalization, patient underwent MRCP followed by ERCP on 04/10/2022 with findings of choledocholithiasis.  Patient underwent biliary sphincterectomy and sphincteroplasty with stone extraction and was noted to have cholelithiasis.  General surgery on board and plan for cholecystectomy 04/11/22.   Assessment and plan.   Acute choledocholithiasis.   Right upper quadrant pain, nausea vomiting.  History of gallstones elevated LFT.   Patient was initially treated conservatively with IV fluids and analgesics.  Had MRCP and ERCP subsequently with extraction of the CBD stone.  At this time general surgery is on board and plan for cholecystectomy today.  LFTs have trended down.  Will monitor closely.  History of hypertension On lisinopril hydrochlorothiazide.  Overall blood pressure stable.   History of multiple sclerosis on Avenox , currently on hold due to concern for infection, surgery.  Will restart after surgery is done.   DVT prophylaxis:  SCDs Start: 04/08/22 1937   Code Status:     Code Status: Full Code  Disposition: Home likely in 1 to 2 days.  Status is: Inpatient  Remains inpatient appropriate because: CBD dilatation, cholelithiasis with choledocholithiasis, status post ERCP, awaiting for cholecystectomy.   Family Communication: None at bedside  Consultants:  General surgery GI  Procedures:  MRCP ERCP with biliary sphincterectomy and sphincteroplasty with stone extraction on 04/10/2022  Antimicrobials:  None  Anti-infectives (From admission, onward)    None       Subjective: Today, patient was seen and examined at bedside.  Had mild nausea yesterday but denies any vomiting, overt pain, fever, chills or rigor.  Waiting for surgical intervention.    Objective: Vitals:   04/10/22 1700 04/10/22 1923 04/11/22 0403 04/11/22 0758  BP: (!) 173/78 (!) 156/82 118/68 128/84  Pulse: (!) 55 (!) 54 (!) 59 (!) 59  Resp:  18 18 18   Temp:  98.4 F (36.9 C) 98.2 F (36.8 C) 99.2 F (37.3 C)  TempSrc:  Oral Oral Oral  SpO2:  99% 97% 98%  Weight:      Height:        Intake/Output Summary (Last 24 hours) at 04/11/2022 1021 Last data filed at 04/10/2022 1600 Gross per 24 hour  Intake 1327.26 ml  Output --  Net 1327.26 ml    Filed Weights   04/08/22 1640  Weight: 73.9 kg    Physical Examination: Body mass index is 27.11 kg/m.   General:  Average built, not in obvious distress, alert awake and Communicative HENT:   Mild icteric.  Oral cavity moist. Chest:  Clear breath sounds.  Diminished  breath sounds bilaterally. No crackles or wheezes.  CVS: S1 &S2 heard. No murmur.  Regular rate and rhythm. Abdomen: Soft, nontender, nondistended.  Bowel sounds are heard.   Extremities: No cyanosis, clubbing or edema.  Peripheral pulses are palpable. Psych: Alert, awake and oriented, normal mood CNS:  No cranial nerve deficits.  Power equal in all extremities.   Skin: Warm and dry.  No rashes noted.  Data  Reviewed:   CBC: Recent Labs  Lab 04/08/22 0052 04/08/22 0846 04/09/22 0433 04/10/22 0440 04/11/22 0528  WBC 13.3* 10.8* 6.1 5.6 6.0  NEUTROABS 11.1* 8.9*  --   --   --   HGB 14.9 14.5 14.0 14.5 14.3  HCT 42.5 43.2 40.1 44.0 41.0  MCV 86.6 87.8 87.7 89.2 86.5  PLT 212 198 182 215 218     Basic Metabolic Panel: Recent Labs  Lab 04/08/22 0052 04/08/22 0846 04/09/22 0433 04/10/22 0440 04/11/22 0528  NA 138 139 140 138 138  K 3.5 4.0 3.9 3.9 3.6  CL 106 104 108 105 102  CO2 23 25 24 23 23   GLUCOSE 137* 122* 101* 99 100*  BUN 15 11 8  7* 7*  CREATININE 0.83 0.94 1.01* 0.88 0.87  CALCIUM 9.5 9.6 9.0 9.2 9.4  MG  --  2.0  --  2.2  --      Liver Function Tests: Recent Labs  Lab 04/08/22 0052 04/08/22 0846 04/09/22 0433 04/10/22 0440 04/11/22 0528  AST 72* 280* 221* 148* 68*  ALT 41 187* 270* 241* 161*  ALKPHOS 48 56 57 83 80  BILITOT 1.7* 3.5* 5.4* 2.9* 1.6*  PROT 7.2 7.1 6.7 7.1 6.8  ALBUMIN 4.0 3.8 3.4* 3.7 3.5      Radiology Studies: DG ERCP  Result Date: 04/10/2022 CLINICAL DATA:  ERCP for choledocholithiasis. EXAM: ERCP TECHNIQUE: Multiple spot images obtained with the fluoroscopic device and submitted for interpretation post-procedure. COMPARISON:  MRCP-04/09/2022 FLUOROSCOPY TIME: FLUOROSCOPY TIME 5.3 mGy FINDINGS: Nine spot intraoperative fluoroscopic images of the right upper abdominal quadrant during ERCP are provided for review Initial image demonstrates an ERCP probe overlying the right upper abdominal quadrant Subsequent images demonstrate selective cannulation and opacification of the common bile duct which appears moderately dilated. There are several persistent nonocclusive filling defects within the CBD suggestive of choledocholithiasis. There is minimal opacification of the intrahepatic biliary tree which appears nondilated. There is slight opacification of the cystic duct as well as opacification of the gallbladder lumen with several filling  defects within the gallbladder compatible with known cholelithiasis. Subsequent images demonstrate insufflation of a balloon within the distal aspect of the CBD. Subsequent images demonstrate insufflation of a balloon within the CBD with subsequent biliary sweeping, presumed stone extraction and sphincterotomy. IMPRESSION: ERCP with findings of cholelithiasis and choledocholithiasis with subsequent biliary sweeping and presumed stone extraction and sphincterotomy. These images were submitted for radiologic interpretation only. Please see the procedural report for the amount of contrast and the fluoroscopy time utilized. Electronically Signed   By: Simonne ComeJohn  Watts M.D.   On: 04/10/2022 14:54   MR ABDOMEN MRCP WO CONTRAST  Result Date: 04/09/2022 CLINICAL DATA:  62 year old female with right upper quadrant abdominal pain. EXAM: MRI ABDOMEN WITHOUT CONTRAST  (INCLUDING MRCP) TECHNIQUE: Multiplanar multisequence MR imaging of the abdomen was performed. Heavily T2-weighted images of the biliary and pancreatic ducts were obtained, and three-dimensional MRCP images were rendered by post processing. COMPARISON:  No prior abdominal MRI. Abdominal ultrasound 04/08/2022. FINDINGS: Comment: Today's study is limited for detection  and characterization of visceral and/or vascular lesions by lack of IV gadolinium. Lower chest: Unremarkable. Hepatobiliary: Out of phase imaging demonstrates diffuse loss of signal intensity throughout the hepatic parenchyma, indicative of a background of hepatic steatosis. No discrete cystic or solid hepatic lesions are confidently identified on today's noncontrast examination. MRCP images demonstrate mild intra and extrahepatic biliary ductal dilatation with the common bile duct measuring up to 10 mm in the porta hepatis. In the distal common bile duct (coronal MRCP image 95 of series 9) there is a 6 mm filling defect, compatible with choledocholithiasis. Multiple small filling defects are also noted  lying dependently in the gallbladder measuring up to 13 mm in diameter. Gallbladder is moderately distended. Gallbladder wall appears mildly edematous with trace volume of pericholecystic fluid concerning for early acute cholecystitis. Pancreas: No definite pancreatic mass identified on today's noncontrast examination. No pancreatic ductal dilatation. No peripancreatic fluid collections or inflammatory changes. Spleen:  Unremarkable. Adrenals/Urinary Tract: Bilateral kidneys and adrenal glands are normal in appearance on today's noncontrast examination. No hydroureteronephrosis in the visualized portions of the abdomen. Stomach/Bowel: Numerous colonic diverticula are noted. Visualized portions are otherwise unremarkable. Vascular/Lymphatic: No aneurysm identified in the visualized abdominal vasculature. No lymphadenopathy noted in the abdomen. Other: No significant volume of ascites noted in the visualized portions of the peritoneal cavity. Musculoskeletal: No aggressive appearing osseous lesions are noted in the visualized portions of the skeleton. IMPRESSION: 1. Cholelithiasis with imaging findings concerning for early acute cholecystitis. Notably, however, there was no sonographic Murphy's sign on the recent ultrasound examination. Further clinical evaluation is suggested. 2. Study is positive for choledocholithiasis with a 6 mm stone in the distal common bile duct with mild proximal intra and extrahepatic biliary ductal dilatation indicating mild obstruction. 3. Colonic diverticulosis. 4. Hepatic steatosis. Electronically Signed   By: Trudie Reed M.D.   On: 04/09/2022 11:42      LOS: 3 days    Joycelyn Das, MD [facility] Available via Epic secure chat 7am-7pm After these hours, please refer to coverage provider listed on [email or web address] 04/11/2022, 10:21 AM

## 2022-04-11 NOTE — Anesthesia Procedure Notes (Signed)
Procedure Name: Intubation Date/Time: 04/11/2022 3:33 PM  Performed by: Gus Puma, CRNAPre-anesthesia Checklist: Patient identified, Emergency Drugs available, Suction available and Patient being monitored Patient Re-evaluated:Patient Re-evaluated prior to induction Oxygen Delivery Method: Circle System Utilized Preoxygenation: Pre-oxygenation with 100% oxygen Induction Type: IV induction Ventilation: Mask ventilation without difficulty Laryngoscope Size: Mac and 3 Grade View: Grade I Tube type: Oral Tube size: 7.0 mm Number of attempts: 1 Airway Equipment and Method: Stylet and Oral airway Placement Confirmation: ETT inserted through vocal cords under direct vision, positive ETCO2 and breath sounds checked- equal and bilateral Tube secured with: Tape Dental Injury: Teeth and Oropharynx as per pre-operative assessment  Difficulty Due To: Difficulty was unanticipated Comments: Performed by Natividad Brood SRNA

## 2022-04-11 NOTE — Op Note (Signed)
Patient: Angela Monroe (1960/01/29, [id number])  Date of Surgery: 04/11/22  Preoperative Diagnosis: Cholecystitis   Postoperative Diagnosis: Cholecystitis   Surgical Procedure: LAPAROSCOPIC CHOLECYSTECTOMY:    Operative Team Members:  Surgeon(s) and Role:    * Kymani Shimabukuro, Hyman Hopes, MD - Primary   Anesthesiologist: Eilene Ghazi, MD CRNA: Gus Puma, CRNA Student Nurse Anesthetist: Marcy Siren, RN   Anesthesia: General   Fluids:  No intake/output data recorded.  Complications: (1) Difficult to intubate - unexpected  Comments: Filed from anesthesia note documentation.  Drains:  none   Specimen:  ID Type Source Tests Collected by Time Destination  1 : gallbladder Tissue PATH Gallbladder SURGICAL PATHOLOGY Setsuko Robins, Hyman Hopes, MD 04/11/2022 1552      Disposition:  PACU - hemodynamically stable.  Plan of Care: Discharge to home after PACU    Indications for Procedure: [name] is a 62 y.o. female who presented with abdominal pain.  History, physical and imaging was concerning for choledocholithiasis.  ERCP was completed yesterday, successful without complication.  Laparoscopic cholecystectomy was recommended for the patient.  The procedure itself, as well as the risks, benefits and alternatives were discussed with the patient.  Risks discussed included but were not limited to the risk of infection, bleeding, damage to nearby structures, need to convert to open procedure, incisional hernia, bile leak, common bile duct injury and the need for additional procedures or surgeries.  With this discussion complete and all questions answered the patient granted consent to proceed.  Findings: Gallstones  Infection status: Patient: [name] Patient Case: Urgent Infection Present At Time Of Surgery (PATOS):  Inflamed gallbladder   Description of Procedure:   On the date stated above, the patient was taken to the operating room  suite and placed in supine positioning.  Sequential compression devices were placed on the lower extremities to prevent blood clots.  General endotracheal anesthesia was induced. Preoperative antibiotics were given.  The patient's abdomen was prepped and draped in the usual sterile fashion.  A time-out was completed verifying the correct patient, procedure, positioning and equipment needed for the case.  We began by anesthetizing the skin with local anesthetic and then making a 5 mm incision just below the umbilicus.  We dissected through the subcutaneous tissues to the fascia.  The fascia was grasped and elevated using a Kocher clamp.  A Veress needle was inserted into the abdomen and the abdomen was insufflated to 15 mmHg.  A 5 mm trocar was inserted in this position under optical guidance and then the abdomen was inspected.  There was no trauma to the underlying viscera with initial trocar placement.  Any abnormal findings, other than inflammation in the right upper quadrant, are listed above in the findings section.  Three additional trocars were placed, one 12 mm trocar in the subxiphoid position, one 5 mm trocar in the midline epigastric area and one 4mm trocar in the right upper quadrant subcostally.  These were placed under direct vision without any trauma to the underlying viscera.    The patient was then placed in head up, left side down positioning.  The gallbladder was identified and dissected free from its attachments to the omentum allowing the duodenum to fall away.  The infundibulum of the gallbladder was dissected free working laterally to medially.  The cystic duct and cystic artery were dissected free from surrounding connective tissue.  The infundibulum of the gallbladder was dissected off the cystic plate.  A critical  view of safety was obtained with the cystic duct and cystic artery being cleared of connective tissues and clearly the only two structures entering into the gallbladder with  the liver clearly visible behind.  Clips were then applied to the cystic duct and cystic artery and then these structures were divided.  A PDS endoloop was placed on the cystic duct stump.  The gallbladder was dissected off the cystic plate, placed in an endocatch bag and removed from the 12 mm subxiphoid port site.  The clips were inspected and appeared effective.  The cystic plate was inspected and hemostasis was obtained using electrocautery.  A suction irrigator was used to clean the operative field.  Attention was turned to closure.  The 12 mm subxiphoid port site was closed using a 0-vicryl suture on a fascial suture passer.  The abdomen was desufflated.  The skin was closed using 4-0 monocryl and dermabond.  All sponge and needle counts were correct at the conclusion of the case.    Ivar Drape, MD General, Bariatric, & Minimally Invasive Surgery [facility], [place]

## 2022-04-11 NOTE — Anesthesia Preprocedure Evaluation (Signed)
Anesthesia Evaluation  Patient identified by MRN, date of birth, ID band Patient awake    Reviewed: Allergy & Precautions, H&P , NPO status , Patient's Chart, lab work & pertinent test results  Airway Mallampati: II  TM Distance: >3 FB Neck ROM: Full    Dental no notable dental hx.    Pulmonary neg pulmonary ROS   Pulmonary exam normal breath sounds clear to auscultation       Cardiovascular hypertension, Normal cardiovascular exam Rhythm:Regular Rate:Normal     Neuro/Psych negative neurological ROS  negative psych ROS   GI/Hepatic negative GI ROS, Neg liver ROS,,,  Endo/Other  negative endocrine ROS    Renal/GU negative Renal ROS  negative genitourinary   Musculoskeletal negative musculoskeletal ROS (+)    Abdominal   Peds negative pediatric ROS (+)  Hematology negative hematology ROS (+)   Anesthesia Other Findings   Reproductive/Obstetrics negative OB ROS                             Anesthesia Physical Anesthesia Plan  ASA: 2  Anesthesia Plan: General   Post-op Pain Management: Minimal or no pain anticipated   Induction: Intravenous  PONV Risk Score and Plan: 3 and Ondansetron, Dexamethasone and Treatment may vary due to age or medical condition  Airway Management Planned: Oral ETT  Additional Equipment:   Intra-op Plan:   Post-operative Plan: Extubation in OR  Informed Consent: I have reviewed the patients History and Physical, chart, labs and discussed the procedure including the risks, benefits and alternatives for the proposed anesthesia with the patient or authorized representative who has indicated his/her understanding and acceptance.     Dental advisory given  Plan Discussed with: CRNA and Surgeon  Anesthesia Plan Comments:        Anesthesia Quick Evaluation

## 2022-04-11 NOTE — Progress Notes (Signed)
Patient has been discharged home with her friend. PIV discontinued and site looks, clean dry, intact. Education provided regarding her home medication and patient verbalize understanding. All her belongings and AVS paper has been handed to patient.

## 2022-04-11 NOTE — Transfer of Care (Signed)
Immediate Anesthesia Transfer of Care Note  Patient: Angela Monroe  Procedure(s) Performed: LAPAROSCOPIC CHOLECYSTECTOMY (Abdomen)  Patient Location: PACU  Anesthesia Type:General  Level of Consciousness: awake, drowsy, and patient cooperative  Airway & Oxygen Therapy: Patient Spontanous Breathing and Patient connected to nasal cannula oxygen  Post-op Assessment: Report given to RN and Post -op Vital signs reviewed and stable  Post vital signs: Reviewed and stable  Last Vitals:  Vitals Value Taken Time  BP    Temp    Pulse 79 04/11/22 1629  Resp    SpO2 97 % 04/11/22 1629  Vitals shown include unvalidated device data.  Last Pain:  Vitals:   04/11/22 1029  TempSrc:   PainSc: 0-No pain      Patients Stated Pain Goal: 0 (04/09/22 0011)  Complications:  Encounter Notable Events  Notable Event Outcome Phase Comment  Difficult to intubate - unexpected  Intraprocedure Filed from anesthesia note documentation.

## 2022-04-11 NOTE — Progress Notes (Signed)
Progress Note  Day of Surgery  Subjective: Anticipating surgery  Objective: Vital signs in last 24 hours: Temp:  [97.5 F (36.4 C)-99.2 F (37.3 C)] 98.1 F (36.7 C) (04/09 1024) Pulse Rate:  [54-71] 59 (04/09 0758) Resp:  [12-18] 17 (04/09 1024) BP: (118-179)/(68-97) 142/76 (04/09 1024) SpO2:  [97 %-100 %] 98 % (04/09 0758) Weight:  [70.3 kg] 70.3 kg (04/09 1024) Last BM Date : 04/11/22  Intake/Output from previous day: 04/08 0701 - 04/09 0700 In: 1327.3 [P.O.:220; I.V.:1107.3] Out: -  Intake/Output this shift: No intake/output data recorded.  PE: General: pleasant, WD, female who is laying in bed in NAD Lungs: Respiratory effort nonlabored on room air Abd: soft, ND, mild TTP RUQ without rebound or guarding MSK: all 4 extremities are symmetrical with no cyanosis, clubbing, or edema. Skin: warm and dry Psych: A&Ox3 with an appropriate affect.    Lab Results:  Recent Labs    04/10/22 0440 04/11/22 0528  WBC 5.6 6.0  HGB 14.5 14.3  HCT 44.0 41.0  PLT 215 218    BMET Recent Labs    04/10/22 0440 04/11/22 0528  NA 138 138  K 3.9 3.6  CL 105 102  CO2 23 23  GLUCOSE 99 100*  BUN 7* 7*  CREATININE 0.88 0.87  CALCIUM 9.2 9.4    PT/INR No results for input(s): "LABPROT", "INR" in the last 72 hours. CMP     Component Value Date/Time   NA 138 04/11/2022 0528   K 3.6 04/11/2022 0528   CL 102 04/11/2022 0528   CO2 23 04/11/2022 0528   GLUCOSE 100 (H) 04/11/2022 0528   BUN 7 (L) 04/11/2022 0528   CREATININE 0.87 04/11/2022 0528   CALCIUM 9.4 04/11/2022 0528   PROT 6.8 04/11/2022 0528   ALBUMIN 3.5 04/11/2022 0528   AST 68 (H) 04/11/2022 0528   ALT 161 (H) 04/11/2022 0528   ALKPHOS 80 04/11/2022 0528   BILITOT 1.6 (H) 04/11/2022 0528   GFRNONAA >60 04/11/2022 0528   GFRAA >60 11/24/2015 1635   Lipase     Component Value Date/Time   LIPASE 39 04/08/2022 0052       Studies/Results: DG ERCP  Result Date: 04/10/2022 CLINICAL DATA:  ERCP  for choledocholithiasis. EXAM: ERCP TECHNIQUE: Multiple spot images obtained with the fluoroscopic device and submitted for interpretation post-procedure. COMPARISON:  MRCP-04/09/2022 FLUOROSCOPY TIME: FLUOROSCOPY TIME 5.3 mGy FINDINGS: Nine spot intraoperative fluoroscopic images of the right upper abdominal quadrant during ERCP are provided for review Initial image demonstrates an ERCP probe overlying the right upper abdominal quadrant Subsequent images demonstrate selective cannulation and opacification of the common bile duct which appears moderately dilated. There are several persistent nonocclusive filling defects within the CBD suggestive of choledocholithiasis. There is minimal opacification of the intrahepatic biliary tree which appears nondilated. There is slight opacification of the cystic duct as well as opacification of the gallbladder lumen with several filling defects within the gallbladder compatible with known cholelithiasis. Subsequent images demonstrate insufflation of a balloon within the distal aspect of the CBD. Subsequent images demonstrate insufflation of a balloon within the CBD with subsequent biliary sweeping, presumed stone extraction and sphincterotomy. IMPRESSION: ERCP with findings of cholelithiasis and choledocholithiasis with subsequent biliary sweeping and presumed stone extraction and sphincterotomy. These images were submitted for radiologic interpretation only. Please see the procedural report for the amount of contrast and the fluoroscopy time utilized. Electronically Signed   By: Simonne Come M.D.   On: 04/10/2022 14:54  Anti-infectives: Anti-infectives (From admission, onward)    None        Assessment/Plan Cholelithiasis, Choledocholithiasis - MRCP 4/7, ERCP 4/8 without complication - Today I recommended laparoscopic cholecystectomy.  We discussed the procedure, its risks, benefits and alterantives.  After a full discussion and all questions answered, the  patient granted consent to proceed.  We will proceed as scheduled.  I reviewed Consultant GI notes, hospitalist notes, last 24 h vitals and pain scores, last 48 h intake and output, last 24 h labs and trends, and last 24 h imaging results.    LOS: 3 days   Quentin Ore, MD [facility] 04/11/2022, 1:39 PM Please see Amion for pager number during day hours 7:00am-4:30pm

## 2022-04-12 ENCOUNTER — Encounter (HOSPITAL_COMMUNITY): Payer: Self-pay | Admitting: Surgery

## 2022-04-13 DIAGNOSIS — K810 Acute cholecystitis: Secondary | ICD-10-CM | POA: Diagnosis present

## 2022-04-13 LAB — SURGICAL PATHOLOGY

## 2022-04-14 ENCOUNTER — Encounter (HOSPITAL_COMMUNITY): Payer: Self-pay | Admitting: Gastroenterology

## 2022-04-14 NOTE — Anesthesia Postprocedure Evaluation (Signed)
Anesthesia Post Note  Patient: CHUN HASSEY  Procedure(s) Performed: LAPAROSCOPIC CHOLECYSTECTOMY (Abdomen)     Patient location during evaluation: PACU Anesthesia Type: General Level of consciousness: awake and alert Pain management: pain level controlled Vital Signs Assessment: post-procedure vital signs reviewed and stable Respiratory status: spontaneous breathing, nonlabored ventilation, respiratory function stable and patient connected to nasal cannula oxygen Cardiovascular status: blood pressure returned to baseline and stable Postop Assessment: no apparent nausea or vomiting Anesthetic complications: yes  Encounter Notable Events  Notable Event Outcome Phase Comment  Difficult to intubate - unexpected  Intraprocedure Filed from anesthesia note documentation.    Last Vitals:  Vitals:   04/11/22 1700 04/11/22 1715  BP: (!) 166/80 (!) 164/77  Pulse: (!) 56 (!) 57  Resp: 17 14  Temp:  36.6 C  SpO2: 98% 95%    Last Pain:  Vitals:   04/11/22 1715  TempSrc:   PainSc: 4                  Iness Pangilinan S
